# Patient Record
Sex: Female | Born: 1985 | Hispanic: No | Marital: Single | Smoking: Never smoker
Health system: Southern US, Community
[De-identification: ages and names within clinical notes are randomized; demographics above are authoritative.]

---
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
## 2010-06-27 ENCOUNTER — Encounter (INDEPENDENT_AMBULATORY_CARE_PROVIDER_SITE_OTHER): Payer: Self-pay | Admitting: *Deleted

## 2010-06-27 ENCOUNTER — Other Ambulatory Visit: Payer: Self-pay | Admitting: Gastroenterology

## 2010-06-27 ENCOUNTER — Ambulatory Visit
Admission: RE | Admit: 2010-06-27 | Discharge: 2010-06-27 | Payer: Self-pay | Source: Home / Self Care | Attending: Gastroenterology | Admitting: Gastroenterology

## 2010-06-27 DIAGNOSIS — K219 Gastro-esophageal reflux disease without esophagitis: Secondary | ICD-10-CM | POA: Insufficient documentation

## 2010-06-27 DIAGNOSIS — R1012 Left upper quadrant pain: Secondary | ICD-10-CM | POA: Insufficient documentation

## 2010-06-27 LAB — BASIC METABOLIC PANEL
BUN: 14 mg/dL (ref 6–23)
CO2: 29 mEq/L (ref 19–32)
Calcium: 9.6 mg/dL (ref 8.4–10.5)
Chloride: 104 mEq/L (ref 96–112)
Creatinine, Ser: 0.7 mg/dL (ref 0.4–1.2)
GFR: 112.27 mL/min (ref >60.00)
Glucose, Bld: 83 mg/dL (ref 70–99)
Potassium: 5.2 mEq/L — ABNORMAL HIGH (ref 3.5–5.1)
Sodium: 140 mEq/L (ref 135–145)

## 2010-06-27 LAB — CBC WITH DIFFERENTIAL/PLATELET
Basophils Absolute: 0 10*3/uL (ref 0.0–0.1)
Basophils Relative: 0.6 % (ref 0.0–3.0)
Eosinophils Absolute: 0.4 10*3/uL (ref 0.0–0.7)
Eosinophils Relative: 5.7 % — ABNORMAL HIGH (ref 0.0–5.0)
HCT: 40.8 % (ref 36.0–46.0)
Hemoglobin: 13.8 g/dL (ref 12.0–15.0)
Lymphocytes Relative: 32.9 % (ref 12.0–46.0)
Lymphs Abs: 2.5 10*3/uL (ref 0.7–4.0)
MCHC: 33.8 g/dL (ref 30.0–36.0)
MCV: 85.9 fl (ref 78.0–100.0)
Monocytes Absolute: 0.6 10*3/uL (ref 0.1–1.0)
Monocytes Relative: 7.4 % (ref 3.0–12.0)
Neutro Abs: 4 10*3/uL (ref 1.4–7.7)
Neutrophils Relative %: 53.4 % (ref 43.0–77.0)
Platelets: 276 10*3/uL (ref 150.0–400.0)
RBC: 4.75 Mil/uL (ref 3.87–5.11)
RDW: 13 % (ref 11.5–14.6)
WBC: 7.5 10*3/uL (ref 4.5–10.5)

## 2010-06-27 LAB — IGA: IgA: 298 mg/dL (ref 68–378)

## 2010-06-27 LAB — HEPATIC FUNCTION PANEL
ALT: 15 U/L (ref 0–35)
AST: 17 U/L (ref 0–37)
Albumin: 4.3 g/dL (ref 3.5–5.2)
Alkaline Phosphatase: 45 U/L (ref 39–117)
Bilirubin, Direct: 0.1 mg/dL (ref 0.0–0.3)
Total Bilirubin: 0.6 mg/dL (ref 0.3–1.2)
Total Protein: 7.6 g/dL (ref 6.0–8.3)

## 2010-06-27 LAB — FOLATE: Folate: 11 ng/mL (ref >5.9)

## 2010-06-27 LAB — IBC PANEL
Iron: 74 ug/dL (ref 42–145)
Saturation Ratios: 17.5 % — ABNORMAL LOW (ref 20.0–50.0)
Transferrin: 302.8 mg/dL (ref 212.0–360.0)

## 2010-06-27 LAB — VITAMIN B12: Vitamin B-12: 405 pg/mL (ref 211–911)

## 2010-06-27 LAB — AMYLASE: Amylase: 47 U/L (ref 27–131)

## 2010-06-27 LAB — LIPASE: Lipase: 36 U/L (ref 11.0–59.0)

## 2010-06-27 LAB — TSH: TSH: 2.21 u[IU]/mL (ref 0.35–5.50)

## 2010-06-27 LAB — FERRITIN: Ferritin: 6.2 ng/mL — ABNORMAL LOW (ref 10.0–291.0)

## 2010-06-27 LAB — SEDIMENTATION RATE: Sed Rate: 13 mm/hr (ref 0–22)

## 2010-06-30 ENCOUNTER — Ambulatory Visit
Admission: RE | Admit: 2010-06-30 | Discharge: 2010-06-30 | Payer: Self-pay | Source: Home / Self Care | Attending: Gastroenterology | Admitting: Gastroenterology

## 2010-06-30 ENCOUNTER — Telehealth: Payer: Self-pay | Admitting: Gastroenterology

## 2010-06-30 ENCOUNTER — Other Ambulatory Visit: Payer: Self-pay | Admitting: Gastroenterology

## 2010-06-30 ENCOUNTER — Ambulatory Visit (HOSPITAL_COMMUNITY)
Admission: RE | Admit: 2010-06-30 | Discharge: 2010-06-30 | Payer: Self-pay | Source: Home / Self Care | Attending: Gastroenterology | Admitting: Gastroenterology

## 2010-06-30 DIAGNOSIS — R109 Unspecified abdominal pain: Secondary | ICD-10-CM

## 2010-07-01 LAB — HELICOBACTER PYLORI SCREEN-BIOPSY: UREASE: NEGATIVE

## 2010-07-02 ENCOUNTER — Encounter: Payer: Self-pay | Admitting: Gastroenterology

## 2010-07-10 NOTE — Assessment & Plan Note (Addendum)
Summary: STOMACH PAIN AFTER MEALS,ACID REFLUX.Marland KitchenEM   History of Present Illness Visit Type: Initial Visit Primary GI MD: Sheryn Bison MD FACP FAGA Primary Provider: Windle Guard, MD Chief Complaint: Substernal intermittant chest pain that radiates to rib cage with some SOB. Pt denies any N/V. Pt states the pain can last all day and has been going on for 6 months. Pt has some heartburn and reflux througout the day. History of Present Illness:   Very Pleasant 25 year old Caucasian female self referred for evaluation of 6 months of subxiphoid discomfort radiating into her left upper quadrant lasting several hours in duration without precipitating or alleviating elements. She denies dysphagia, reflux symptoms, hepatobiliary complaints, or any systemic complaints. She has tried antacids without relief. She's not had previous radiographs or endoscopic studies.  Her pain occurs almost daily but has no relationship to exercise or meals. She denies any history of trauma. There is no history of any cardiopulmonary complaints. She specifically denies chest pain with exertion, palpitations, shortness of breath, cough or sputum production. She has regular bowel movements without melena or hematochezia.  Family history is remarkable for thyroid goiters in several family members. Patient does smoke a half to one pack of cigarettes per day but denies use of alcohol or NSAIDs.     GI Review of Systems    Reports abdominal pain, acid reflux, belching, bloating, chest pain, and  loss of appetite.     Location of  Abdominal pain: substernal.    Denies dysphagia with liquids, dysphagia with solids, heartburn, nausea, vomiting, vomiting blood, weight loss, and  weight gain.        Denies anal fissure, black tarry stools, change in bowel habit, constipation, diarrhea, diverticulosis, fecal incontinence, heme positive stool, hemorrhoids, irritable bowel syndrome, jaundice, light color stool, liver problems,  rectal bleeding, and  rectal pain. Preventive Screening-Counseling & Management  Alcohol-Tobacco     Smoking Status: current      Drug Use:  no.      Current Medications (verified): 1)  None  Allergies (verified): No Known Drug Allergies  Past History:  Past medical, surgical, family and social histories (including risk factors) reviewed for relevance to current acute and chronic problems.  Past Medical History: Unremarkable  Past Surgical History: elbow arthroscopy  Family History: Reviewed history and no changes required. 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  Alcohol Use - no Daily Caffeine Use 1 daily Illicit Drug Use - no Smoking Status:  current Drug Use:  no  Review of Systems       The patient complains of shortness of breath.  The patient denies allergy/sinus, anemia, anxiety-new, arthritis/joint pain, back pain, blood in urine, breast changes/lumps, change in vision, confusion, cough, coughing up blood, depression-new, fainting, fatigue, fever, headaches-new, hearing problems, heart murmur, heart rhythm changes, itching, menstrual pain, muscle pains/cramps, night sweats, nosebleeds, pregnancy symptoms, skin rash, sleeping problems, sore throat, swelling of feet/legs, swollen lymph glands, thirst - excessive , urination - excessive , urination changes/pain, urine leakage, vision changes, and voice change.         Normal menstrual periods but the patient apparently does not have regular gynecologic evaluations.  Vital Signs:  Patient profile:   25 year old female Height:      66 inches Weight:      141.50 pounds BMI:     22.92 Pulse rate:   88 / minute Pulse rhythm:  regular BP sitting:   112 / 70  (right arm) Cuff size:   regular  Vitals Entered By: Christie Nottingham CMA Duncan Dull) (June 27, 2010 8:29 AM)  Physical Exam  General:  Well developed, well  nourished, no acute distress.healthy appearing.   Head:  Normocephalic and atraumatic. Eyes:  PERRLA, no icterus.exam deferred to patient's ophthalmologist.   Neck:  Supple; no masses or thyromegaly. Lungs:  Clear throughout to auscultation. Heart:  Regular rate and rhythm; no murmurs, rubs,  or bruits. Abdomen:  Soft, nontender and nondistended. No masses, hepatosplenomegaly or hernias noted. Normal bowel sounds. Pulses:  Normal pulses noted. Extremities:  No clubbing, cyanosis, edema or deformities noted. Neurologic:  Alert and  oriented x4;  grossly normal neurologically. Cervical Nodes:  No significant cervical adenopathy. Psych:  Alert and cooperative. Normal mood and affect.   Impression & Recommendations:  Problem # 1:  ABDOMINAL PAIN, LEFT UPPER QUADRANT (ICD-789.02) Assessment Deteriorated Most of her discomfort is in her subxiphoid area. I suspect this is an atypical manifestation of gastroesophageal reflux. I have scheduled her for ultrasonography and endoscopy and lab exams. She's been started on Nexium 40 mg twice a day and p.r.n. sublingual Levsin pending her evaluation. Reflux maneuvers also reviewed. Cardiopulmonary exam today was normal. Orders: EGD (EGD) TLB-CBC Platelet - w/Differential (85025-CBCD) TLB-BMP (Basic Metabolic Panel-BMET) (80048-METABOL) TLB-Hepatic/Liver Function Pnl (80076-HEPATIC) TLB-TSH (Thyroid Stimulating Hormone) (84443-TSH) TLB-B12, Serum-Total ONLY (04540-J81) TLB-Ferritin (82728-FER) TLB-Folic Acid (Folate) (82746-FOL) TLB-IBC Pnl (Iron/FE;Transferrin) (83550-IBC) TLB-IgA (Immunoglobulin A) (82784-IGA) TLB-Amylase (82150-AMYL) TLB-Lipase (83690-LIPASE) TLB-Sedimentation Rate (ESR) (85652-ESR) Ultrasound Abdomen (UAS)  Problem # 2:  GERD (ICD-530.81) Assessment: Comment Only Atypical symptomatology. We need to rule out cholelithiasis, pancreatitis, or upper GI structural lesions. Orders: EGD (EGD) TLB-CBC Platelet - w/Differential  (85025-CBCD) TLB-BMP (Basic Metabolic Panel-BMET) (80048-METABOL) TLB-Hepatic/Liver Function Pnl (80076-HEPATIC) TLB-TSH (Thyroid Stimulating Hormone) (84443-TSH) TLB-B12, Serum-Total ONLY (19147-W29) TLB-Ferritin (82728-FER) TLB-Folic Acid (Folate) (82746-FOL) TLB-IBC Pnl (Iron/FE;Transferrin) (83550-IBC) TLB-IgA (Immunoglobulin A) (82784-IGA) TLB-Amylase (82150-AMYL) TLB-Lipase (83690-LIPASE) TLB-Sedimentation Rate (ESR) (85652-ESR)  Patient Instructions: 1)  Copy sent to :Windle Guard, MD 2)  Your procedure has been scheduled for 06/30/2010, please follow the seperate instructions.  3)  [facility] Patient Information Guide given to patient.  4)  Upper Endoscopy brochure given.  5)  Your abdominal ultrasound is scheduled for 06/30/2010, please follow the seperate instructions.  6)  Avoid foods high in acid content ( tomatoes, citrus juices, spicy foods) . Avoid eating within 3 to 4 hours of lying down or before exercising. Do not over eat; try smaller more frequent meals. Elevate head of bed four inches when sleeping.  7)  GI Reflux brochure given.  8)  Please go to the basement today for your labs.  9)  Your prescription(s) have been sent to you pharmacy.  10)  The medication list was reviewed and reconciled.  All changed / newly prescribed medications were explained.  A complete medication list was provided to the patient / caregiver. Prescriptions: TRAMADOL HCL 50 MG TABS (TRAMADOL HCL) take one by mouth two times a day as needed  #60 x 0   Entered by:   Harlow Mares CMA (AAMA)   Authorized by:   Mardella Layman MD [facility]   Signed by:   Harlow Mares CMA (AAMA) on 06/27/2010   Method used:   Faxed to ...       [facility]  [address] [phone][facility] (retail)       [address]  [place], [place]  [address]       Ph: [phone] or [phone]       Fax: [phone]   RxID:   [id number] HYOMAX 0.125 MG TABS (HYOSCYAMINE SULFATE) take  one by mouth two times a day as needed  #60 x 3   Entered by:   Harlow Mares CMA (AAMA)   Authorized by:   Mardella Layman MD [facility]   Signed by:   Harlow Mares CMA (AAMA) on 06/27/2010   Method used:   [facility]ron[facility]        [facility]  [address] [phone]* (retail)       [address]       [place], [place]  [id number]       Ph: [phone] or [phone]       Fax: [phone]   RxID:   [id number] NEXIUM 40 MG CPDR (ESOMEPRAZOLE MAGNESIUM) take one by mouth two times a day  #60 x 3   Entered by:   Harlow Mares CMA (AAMA)   Authorized by:   Mardella Layman MD [facility]   Signed by:   Harlow Mares CMA (AAMA) on 06/27/2010   Method used:   [facility]        [facility]  [address] [phone]* (retail)       [address]       [place], [place]  [id number]       Ph: [phone] or [phone]       Fax: [phone]   RxID:   [id number]

## 2010-07-10 NOTE — Procedures (Signed)
Summary: Upper Endoscopy  Patient: [name] Note: All result statuses are Final unless otherwise noted.  Tests: (1) Upper Endoscopy (EGD)   EGD Upper Endoscopy       DONE     [facility]     [address]     [place], [place]  [address]           ENDOSCOPY PROCEDURE REPORT           PATIENT:  Erin Hart, Erin Hart  MR#:  [id number]     BIRTHDATE:  [birth year], 24 yrs. old  GENDER:  female           ENDOSCOPIST:  Vania Rea. Jarold Motto, MD, [facility]     Referred by:           PROCEDURE DATE:  06/30/2010     PROCEDURE:  EGD with biopsy, 19147     ASA CLASS:  Class I     INDICATIONS:  LUQ PAIN.ULTRASOUND IS WNL.           MEDICATIONS:   Fentanyl 75 mcg IV, Versed 7 mg IV     TOPICAL ANESTHETIC:  Exactacain Spray           DESCRIPTION OF PROCEDURE:   After the risks benefits and     alternatives of the procedure were thoroughly explained, informed     consent was obtained.  The LB [id number]-[id number] endoscope was     introduced through the mouth and advanced to the second portion of     the duodenum, without limitations.  The instrument was slowly     withdrawn as the mucosa was fully examined.     <<PROCEDUREIMAGES>>           The upper, middle, and distal third of the esophagus were     carefully inspected and no abnormalities were noted. The z-line     was well seen at the GEJ. The endoscope was pushed into the fundus     which was normal including a retroflexed view. The antrum,gastric     body, first and second part of the duodenum were unremarkable.     ESOPHAGEAL AND DUODENAL BIOPSIES DONE.  There were mucosal changes     in the proximal esophagus consistent with a thoracic inlet patch.     SEE PICTURES.    Retroflexed views revealed no abnormalities.     The scope was then withdrawn from the patient and the procedure     completed.           COMPLICATIONS:  None           ENDOSCOPIC IMPRESSION:     1) Normal EGD     2) Thoracic inlet patch     PROBABLE  GERD,NONEROSIVE.     RECOMMENDATIONS:     1) Await biopsy results     2) continue PPI     WILL RX QAM PPI THAT INSURANCE WILL PAY.           REPEAT EXAM:  No           ______________________________     Vania Rea. Jarold Motto, MD, [facility]           CC:  Windle Guard, MD           n.     Rosalie Doctor:   Vania Rea. Patterson at 06/30/2010 03:33 PM           Revonda Standard, [id number]  Note: An exclamation mark (!) indicates  a result that was not dispersed into the flowsheet. Document Creation Date: 06/30/2010 3:33 PM _______________________________________________________________________  (1) Order result status: Final Collection or observation date-time: 06/30/2010 15:23 Requested date-time:  Receipt date-time:  Reported date-time:  Referring Physician:   Ordering Physician: Sheryn Bison [phone]) Specimen Source:  Source: Launa Grill Order Number: [id number] Lab site:

## 2010-07-10 NOTE — Progress Notes (Signed)
Summary: Medication  Phone Note Call from Patient   Caller: Father Erin Hart [phone] Call For: Dr. Jarold Motto Reason for Call: Talk to Nurse Summary of Call: Nexium is too expensive...Marland Kitchenneeds an alternative med Initial call taken by: Karna Christmas,  June 30, 2010 12:16 PM  Follow-up for Phone Call        Left a message on patients machine to call back. Follow-up by: Harlow Mares CMA Duncan Dull),  June 30, 2010 1:32 PM  Additional Follow-up for Phone Call Additional follow up Details #1::        spoke to pt in [facility] she said that the pharm did not have current insurance card and she will call me tomorrow.  Additional Follow-up by: Harlow Mares CMA Duncan Dull),  June 30, 2010 1:41 PM    Additional Follow-up for Phone Call Additional follow up Details #2::    talked to Dr. Jarold Motto and pts father I have sent in omeprazole and an iron supp for her to take each once a day.  Follow-up by: Harlow Mares CMA Duncan Dull),  June 30, 2010 3:37 PM  New/Updated Medications: OMEPRAZOLE 40 MG CPDR (OMEPRAZOLE) take one by mouth once daily FERROUS SULFATE 325 (65 FE) MG TABS (FERROUS SULFATE) take one by mouth once daily Prescriptions: FERROUS SULFATE 325 (65 FE) MG TABS (FERROUS SULFATE) take one by mouth once daily  #30 x 6   Entered by:   Harlow Mares CMA (AAMA)   Authorized by:   Mardella Layman MD [facility]   Signed by:   Harlow Mares CMA (AAMA) on 06/30/2010   Method used:   [facility]        [facility]  [address] [phone]* (retail)       [address]       [place], [place]  [address]       Ph: [phone] or [phone]       Fax: [phone]   RxID:   [id number] OMEPRAZOLE 40 MG CPDR (OMEPRAZOLE) take one by mouth once daily  #30 x 6   Entered by:   Harlow Mares CMA (AAMA)   Authorized by:   Mardella Layman MD [facility]   Signed by:   Harlow Mares CMA (AAMA) on 06/30/2010   Method used:   [facility]ron[facility]        [facility]  [address] [phone]*  (retail)       [address]       [place], [place]  [address]       Ph: [phone] or [phone]       Fax: [phone]   RxID:   [id number]

## 2010-07-10 NOTE — Letter (Signed)
Summary: EGD Instructions  [facility]  [address] [place], [place] [address]   Phone: [phone]  Fax: [phone]       Erin Hart    1985/12/02    MRN: [id number]       Procedure Day Dorna Bloom: Duanne Limerick 06/30/2010     Arrival Time:  1:30pm     Procedure Time: 2:30pm     Location of Procedure:                    [facility] (4th Floor)   PREPARATION FOR ENDOSCOPY   On 06/30/2010 THE DAY OF THE PROCEDURE:  Due to Abdominal Ultrasound same day Nothing to eat or drink after Midnight                                                                                                MEDICATION INSTRUCTIONS  Unless otherwise instructed, you should take regular prescription medications with a small sip of water as early as possible the morning of your procedure.        OTHER INSTRUCTIONS  You will need a responsible adult at least 23 years of age to accompany you and drive you home.   This person must remain in the waiting room during your procedure.  Wear loose fitting clothing that is easily removed.  Leave jewelry and other valuables at home.  However, you may wish to bring a book to read or an iPod/MP3 player to listen to music as you wait for your procedure to start.  Remove all body piercing jewelry and leave at home.  Total time from sign-in until discharge is approximately 2-3 hours.  You should go home directly after your procedure and rest.  You can resume normal activities the day after your procedure.  The day of your procedure you should not:   Drive   Make legal decisions   Operate machinery   Drink alcohol   Return to work  You will receive specific instructions about eating, activities and medications before you leave.    The above instructions have been reviewed and explained to me by   _______________________    I fully understand and can verbalize these instructions _____________________________ Date  _________

## 2010-07-10 NOTE — Letter (Signed)
Summary: Patient Notice-Endo Biopsy Results  [facility]  [address] [place], [place] [address]   Phone: [phone]  Fax: [phone]        July 02, 2010 MRN: [id number]    [facility] [address] [place], [place]  [address]    Dear Ms. Erin Hart,  I am pleased to inform you that the biopsies taken during your recent endoscopic examination did not show any evidence of cancer upon pathologic examination.  Additional information/recommendations:  __No further action is needed at this time.  Please follow-up with      your primary care physician for your other healthcare needs.  __ Please call [phone] to schedule a return visit to review      your condition.  xx__ Continue with the treatment plan as outlined on the day of your      exam.  __ You should have a repeat endoscopic examination for this problem              in _ months/years.   Please call us if you are having persistent problems or have questions about your condition that have not been fully answered at this time.  Sincerely,  Mardella Layman MD [facility]  This letter has been electronically signed by your physician.  Appended Document: Patient Notice-Endo Biopsy Results LETTER MAILED

## 2010-07-10 NOTE — Miscellaneous (Signed)
Summary: Orders Update clo  Clinical Lists Changes  Orders: Added new Test order of TLB-H Pylori Screen Gastric Biopsy (83013-CLOTEST) - Signed 

## 2010-07-29 ENCOUNTER — Encounter: Payer: Self-pay | Admitting: Gastroenterology

## 2010-07-29 ENCOUNTER — Ambulatory Visit (INDEPENDENT_AMBULATORY_CARE_PROVIDER_SITE_OTHER): Payer: BC Managed Care – PPO | Admitting: Gastroenterology

## 2010-07-29 DIAGNOSIS — K219 Gastro-esophageal reflux disease without esophagitis: Secondary | ICD-10-CM

## 2010-08-05 NOTE — Assessment & Plan Note (Signed)
Summary: F/u  after procedures    History of Present Illness Visit Type: Follow-up Visit Primary GI MD: Sheryn Bison MD FACP FAGA Primary Provider: Windle Guard, MD Requesting Provider: na Chief Complaint: F/u from EGD and [place].  Pt states that she is much better on medicines and denies any GI complaints  History of Present Illness:   Endoscopy and ultrasound exam were unremarkable. She is currently asymptomatic on Nexium 40 mg a day and antireflux maneuvers.   GI Review of Systems      Denies abdominal pain, acid reflux, belching, bloating, chest pain, dysphagia with liquids, dysphagia with solids, heartburn, loss of appetite, nausea, vomiting, vomiting blood, weight loss, and  weight gain.        Denies anal fissure, black tarry stools, change in bowel habit, constipation, diarrhea, diverticulosis, fecal incontinence, heme positive stool, hemorrhoids, irritable bowel syndrome, jaundice, light color stool, liver problems, rectal bleeding, and  rectal pain.    Current Medications (verified): 1)  Omeprazole 40 Mg Cpdr (Omeprazole) .... Take One By Mouth Once Daily 2)  Hyomax 0.125 Mg Tabs (Hyoscyamine Sulfate) .... Take One By Mouth Two Times A Day As Needed 3)  Tramadol Hcl 50 Mg Tabs (Tramadol Hcl) .... Take One By Mouth Two Times A Day As Needed  Allergies (verified): No Known Drug Allergies  Past History:  Past medical, surgical, family and social histories (including risk factors) reviewed for relevance to current acute and chronic problems.  Past Medical History: GERD (ICD-530.81) ABDOMINAL PAIN, LEFT UPPER QUADRANT (ICD-789.02)  Past Surgical History: Reviewed history from 06/27/2010 and no changes required. elbow arthroscopy  Family History: Reviewed history from 06/27/2010 and no changes required. No FH of Colon Cancer:  Social History: Reviewed history from 06/27/2010 and no changes required. 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  Alcohol  Use - no Daily Caffeine Use 1 daily Illicit Drug Use - no  Review of Systems  The patient denies allergy/sinus, anemia, anxiety-new, arthritis/joint pain, back pain, blood in urine, breast changes/lumps, change in vision, confusion, cough, coughing up blood, depression-new, fainting, fatigue, fever, headaches-new, hearing problems, heart murmur, heart rhythm changes, itching, menstrual pain, muscle pains/cramps, night sweats, nosebleeds, pregnancy symptoms, shortness of breath, skin rash, sleeping problems, sore throat, swelling of feet/legs, swollen lymph glands, thirst - excessive , urination - excessive , urination changes/pain, urine leakage, vision changes, and voice change.    Vital Signs:  Patient profile:   25 year old female Height:      66 inches Weight:      139 pounds BMI:     22.52 BSA:     1.72 Pulse rate:   88 / minute Pulse rhythm:   regular BP sitting:   118 / 74  (left arm) Cuff size:   regular  Vitals Entered By: Ok Anis CMA (July 29, 2010 3:00 PM)  Physical Exam  General:  Well developed, well nourished, no acute distress.healthy appearing.   Head:  Normocephalic and atraumatic. Eyes:  PERRLA, no icterus.exam deferred to patient's ophthalmologist.   Psych:  Alert and cooperative. Normal mood and affect.   Impression & Recommendations:  Problem # 1:  GERD (ICD-530.81) Assessment Improved Continue Nexium 40 mg a day and standard antireflux maneuvers. Patient education video shown to patient today, and she was placed on the anti-reflux diet. I will see her again in 6 months time or p.r.n. as needed.  Problem # 2:  ABDOMINAL PAIN, LEFT UPPER QUADRANT (ICD-789.02) Assessment: Improved resolved on therapy.  Patient  Instructions: 1)  Copy sent to : Windle Guard, MD 2)  Today you watched the movie on [employer]. 3)  Avoid foods high in acid content ( tomatoes, citrus juices, spicy foods) . Avoid eating within 3 to 4 hours of lying down or before exercising.  Do not over eat; try smaller more frequent meals. Elevate head of bed four inches when sleeping.  4)  The medication list was reviewed and reconciled.  All changed / newly prescribed medications were explained.  A complete medication list was provided to the patient / caregiver.

## 2010-11-17 ENCOUNTER — Telehealth: Payer: Self-pay | Admitting: Gastroenterology

## 2010-11-17 MED ORDER — TRAMADOL HCL 50 MG PO TABS: ORAL_TABLET | ORAL | Status: DC

## 2010-11-17 MED ORDER — OMEPRAZOLE 40 MG PO CPDR: 40.0000 mg | DELAYED_RELEASE_CAPSULE | Freq: Every day | ORAL | Status: DC

## 2010-11-17 NOTE — Telephone Encounter (Signed)
rx sent

## 2011-09-19 IMAGING — US US ABDOMEN COMPLETE
1 series · 13 of 25 positions shown · non-contrast
Comparison: None

CLINICAL DATA: History of abdominal pain.

ABDOMINAL ULTRASOUND COMPLETE

[Series 1: us abdomen complete · 0.26mm/px · 13 of 108 slices shown]
[im 1/108]
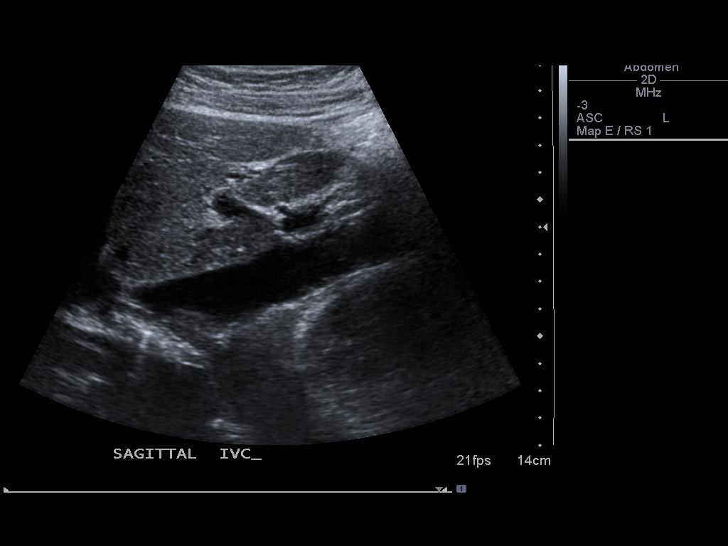
[im 9/108]
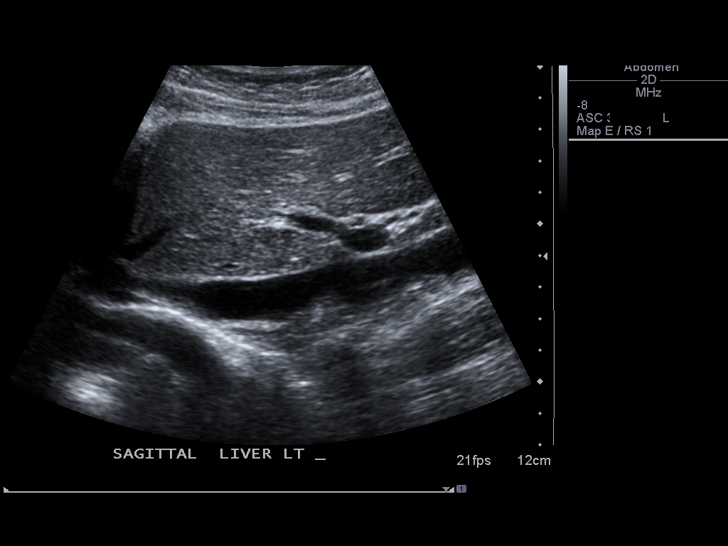
[im 18/108]
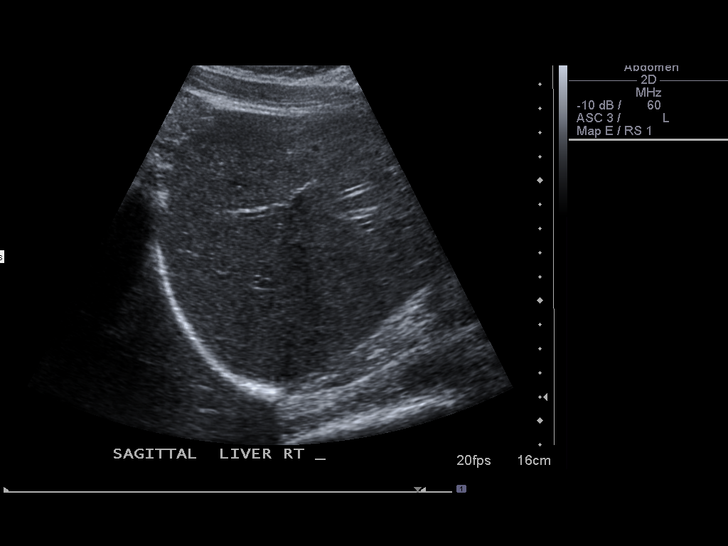
[im 27/108]
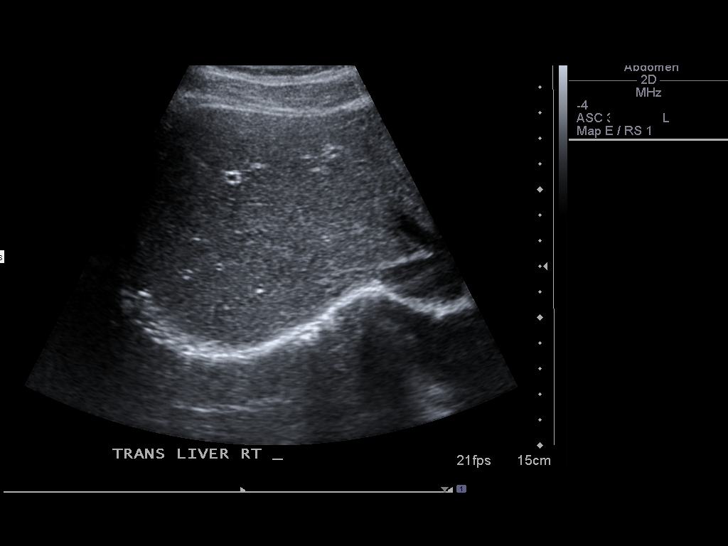
[im 36/108]
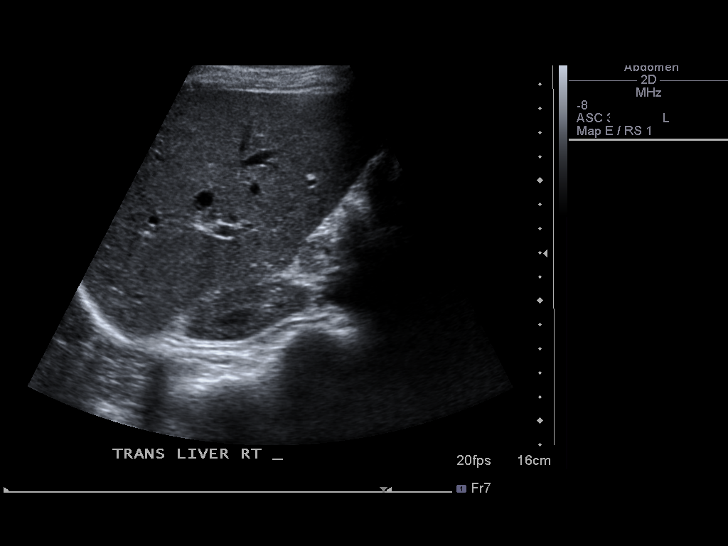
[im 45/108]
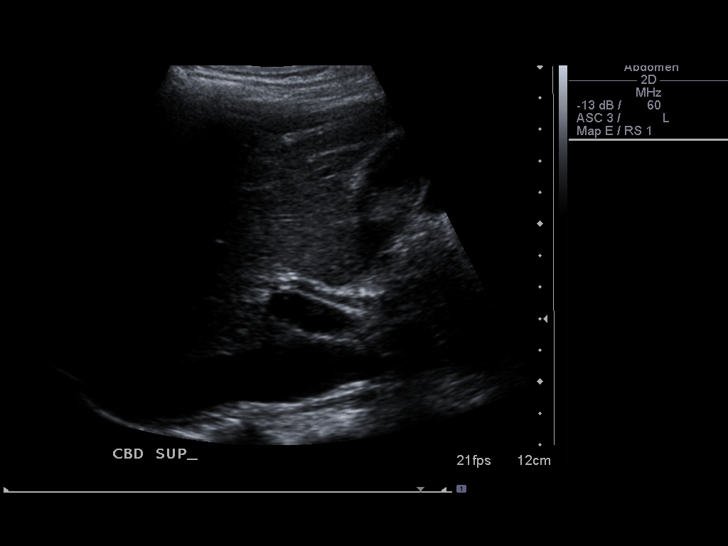
[im 54/108]
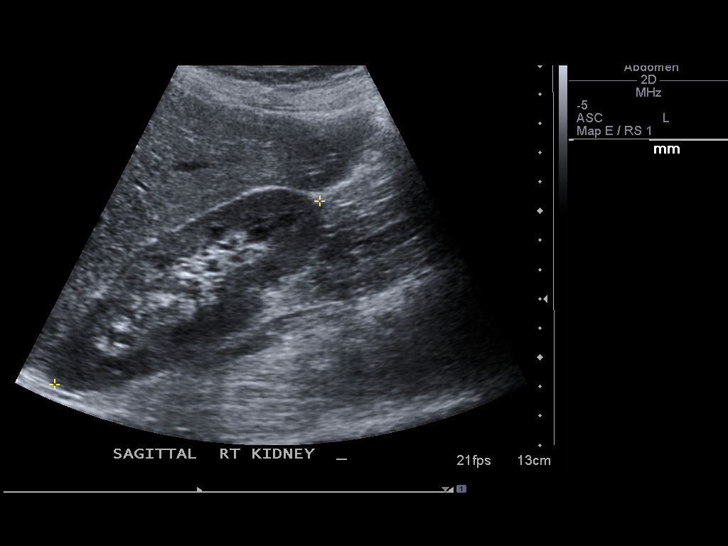
[im 63/108]
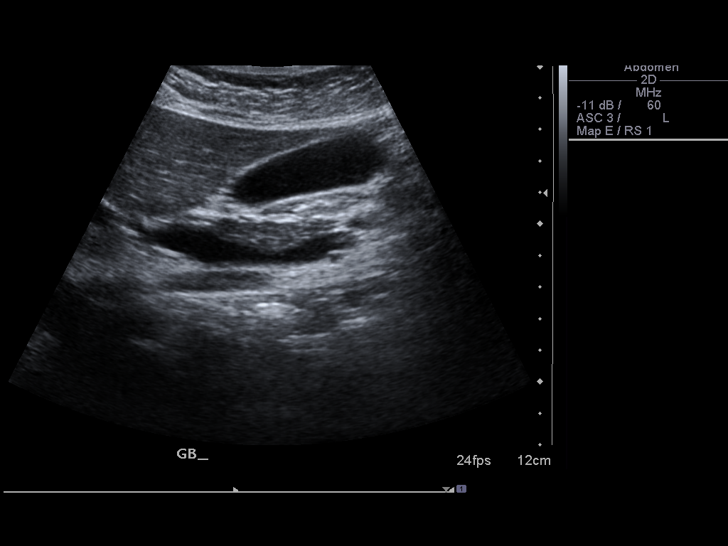
[im 72/108]
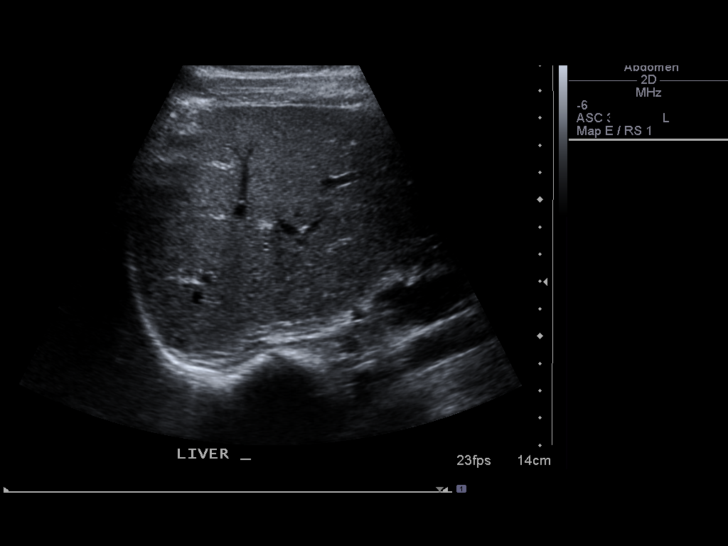
[im 81/108]
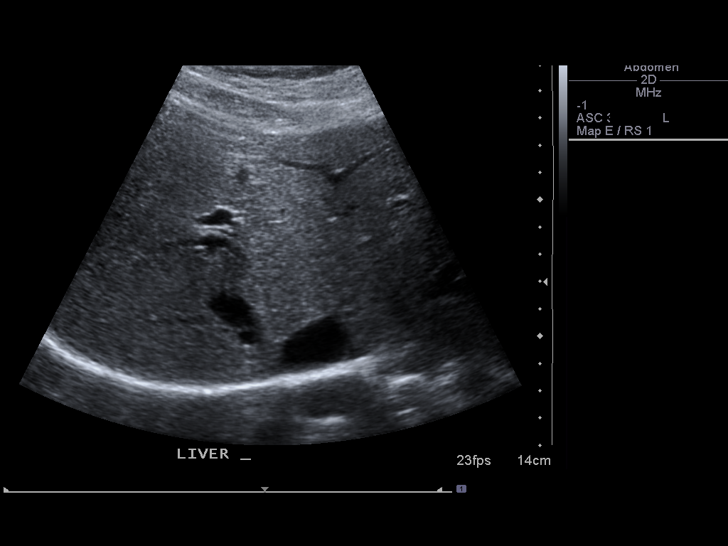
[im 90/108]
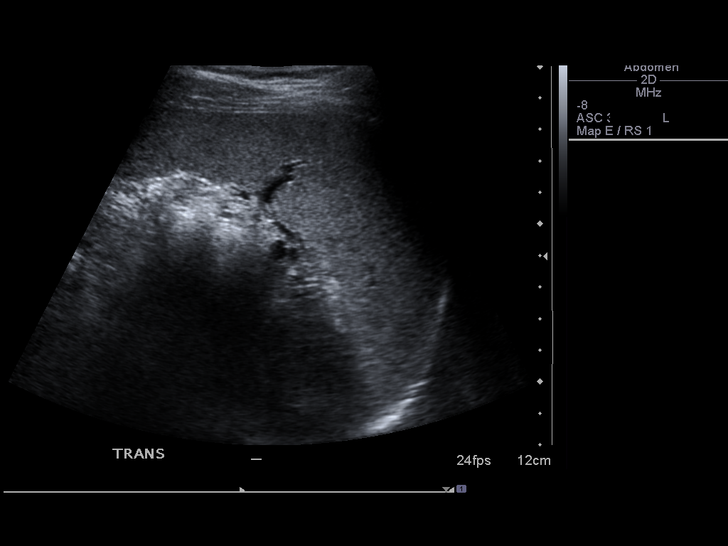
[im 99/108]
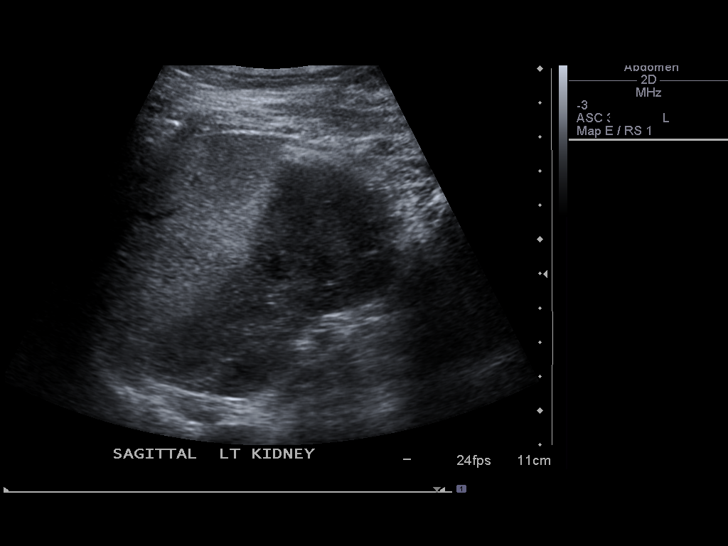
[im 108/108]
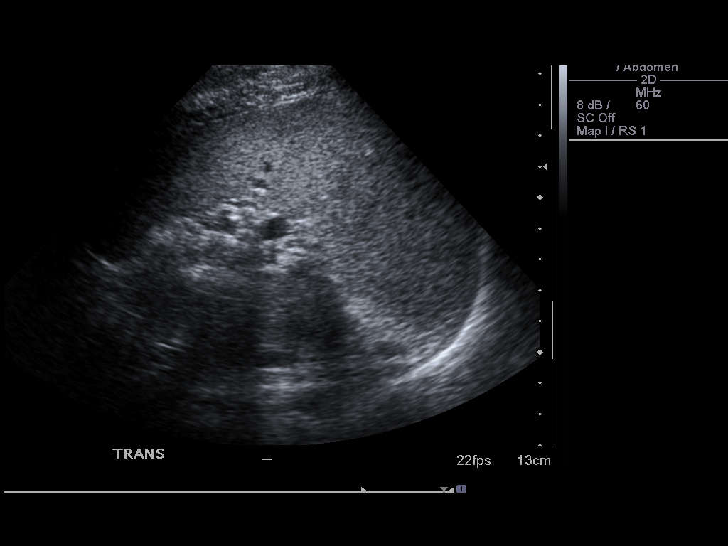

[13 of 25 positions shown; findings below may reference images not displayed]

FINDINGS: Gallbladder: No shadowing gallstones or echogenic sludge. No
gallbladder wall thickening or pericholecystic fluid. The
gallbladder wall thickness measured 2.0 mm. No sonographic Murphy's
sign according to the ultrasound technologist.

CBD: Normal in caliber measuring 2.8 mm. No choledocholithiasis is
evident.

Liver:  Normal size and echotexture without focal parenchymal
abnormality.  There is normal hepatopetal flow in the main portal
vein by color Doppler imaging.

IVC:  Patent throughout its visualized course in the abdomen.

Pancreas:  Although the pancreas is difficult to visualize in its
entirety, no focal pancreatic abnormality is identified.

Spleen:  Spleen is borderline in size with length of 13 cm and
volume of 404 ml.  No focal lesion is seen.

Right kidney:  No hydronephrosis.  Well-preserved cortex.  Normal
parenchymal echotexture without focal abnormalities.  Right renal
length is 11.0 cm.

Left kidney:  No hydronephrosis.  Well-preserved cortex.  Normal
parenchymal echotexture without focal abnormalities.  Left renal
length is 10.7 cm.

Aorta:  Maximum diameter is 1.9 cm.  No aneurysm is evident.

Ascites:  None.
IMPRESSION: No acute abdominal pathology was demonstrated.  Spleen is
borderline in size.  No focal lesion was seen.

## 2014-04-11 ENCOUNTER — Other Ambulatory Visit: Payer: Self-pay | Admitting: Obstetrics and Gynecology

## 2014-04-12 LAB — CYTOLOGY - PAP

## 2014-07-27 ENCOUNTER — Other Ambulatory Visit: Payer: Self-pay | Admitting: Occupational Medicine

## 2014-07-27 ENCOUNTER — Ambulatory Visit: Payer: Self-pay

## 2014-07-27 DIAGNOSIS — Z0000 Encounter for general adult medical examination without abnormal findings: Secondary | ICD-10-CM

## 2015-10-16 IMAGING — CR DG CHEST 1V
1 series · 1 of 1 positions shown · non-contrast
Comparison: None.

CLINICAL DATA: Current smoker.  Physical exam.

EXAM:
CHEST  1 VIEW

[view not recorded]
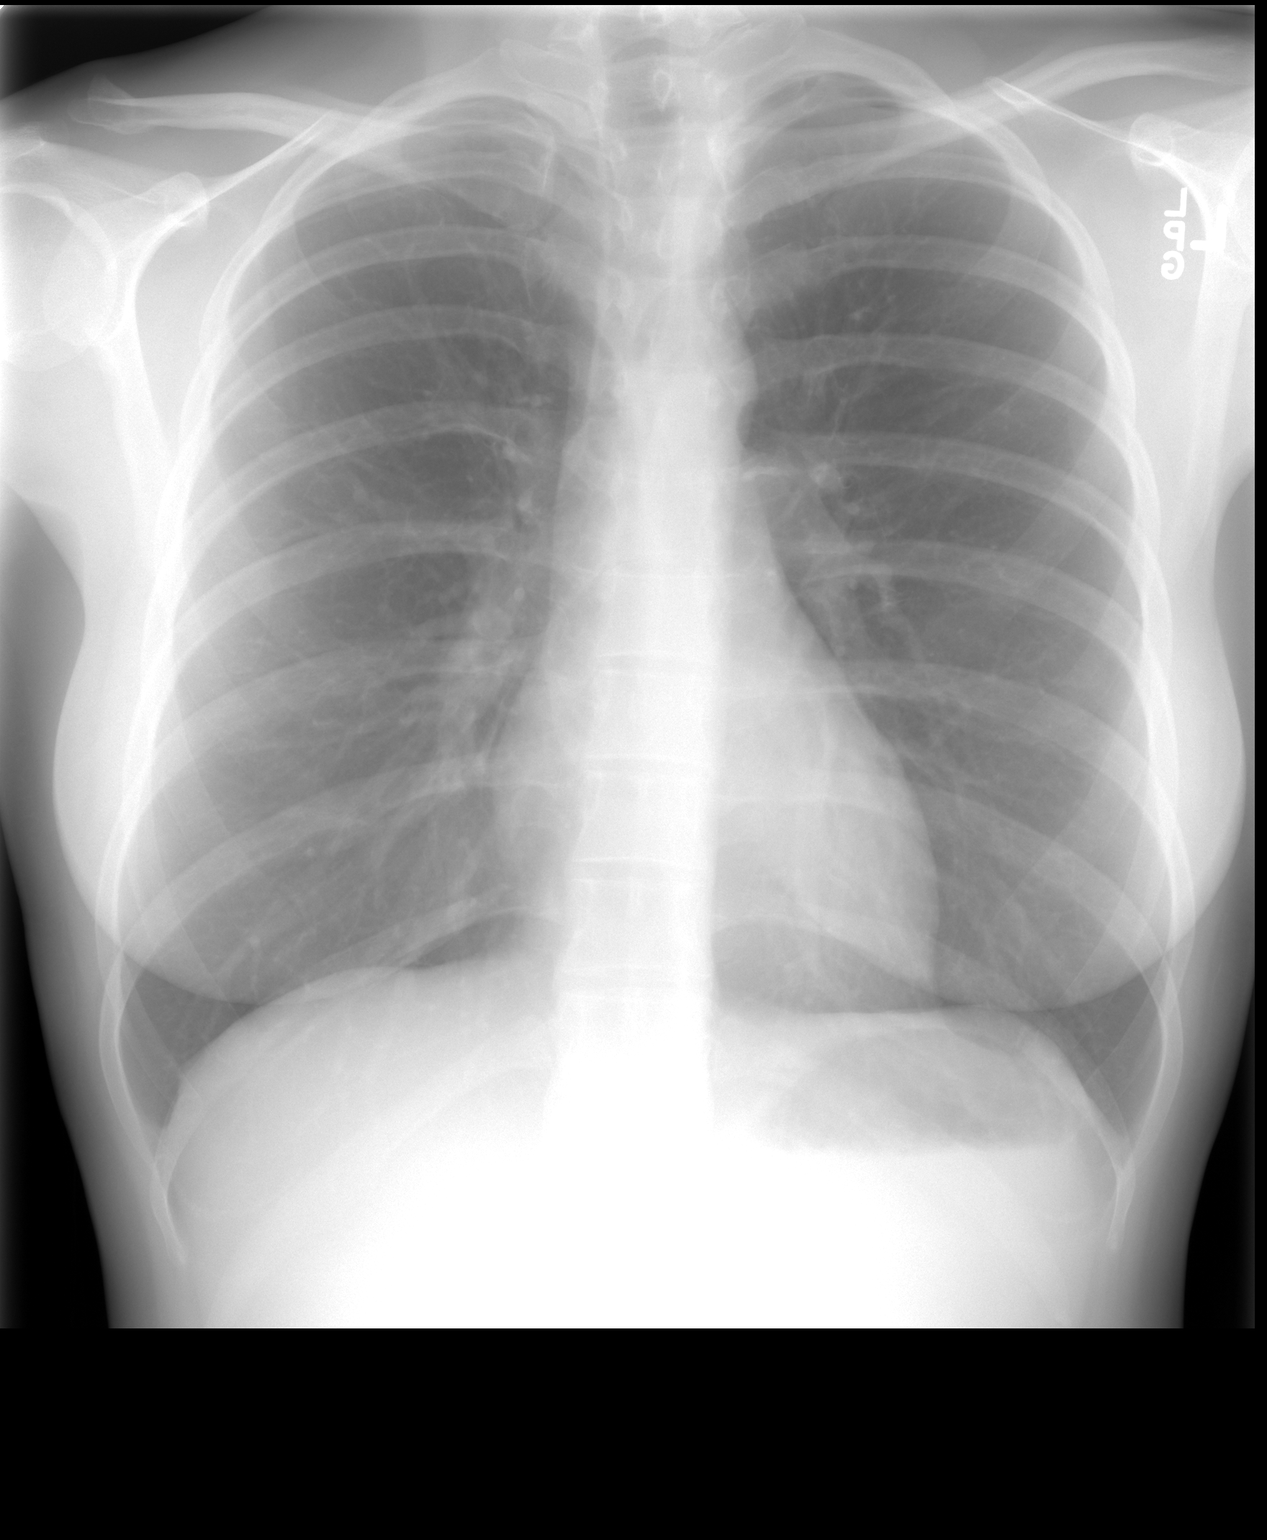

[1 of 1 positions shown; findings below may reference images not displayed]

FINDINGS: The heart size and mediastinal contours are within normal limits.
Both lungs are clear. The visualized skeletal structures are
unremarkable.
IMPRESSION: No active disease.

## 2017-08-27 ENCOUNTER — Other Ambulatory Visit: Payer: Self-pay

## 2017-08-27 ENCOUNTER — Encounter (HOSPITAL_COMMUNITY): Payer: Self-pay | Admitting: Emergency Medicine

## 2017-08-27 ENCOUNTER — Emergency Department (HOSPITAL_COMMUNITY)
Admission: EM | Admit: 2017-08-27 | Discharge: 2017-08-27 | Disposition: A | Discharge status: Home / Self Care | Payer: 59 | Attending: Emergency Medicine | Admitting: Emergency Medicine

## 2017-08-27 DIAGNOSIS — R112 Nausea with vomiting, unspecified: Secondary | ICD-10-CM | POA: Insufficient documentation

## 2017-08-27 DIAGNOSIS — R197 Diarrhea, unspecified: Secondary | ICD-10-CM | POA: Diagnosis not present

## 2017-08-27 DIAGNOSIS — E860 Dehydration: Secondary | ICD-10-CM | POA: Insufficient documentation

## 2017-08-27 LAB — URINALYSIS, ROUTINE W REFLEX MICROSCOPIC
Bilirubin Urine: NEGATIVE
Glucose, UA: NEGATIVE mg/dL
Hgb urine dipstick: NEGATIVE
KETONES UR: 5 mg/dL — AB
Leukocytes, UA: NEGATIVE
Nitrite: NEGATIVE
PROTEIN: 30 mg/dL — AB
Specific Gravity, Urine: 1.024 (ref 1.005–1.030)
pH: 5 (ref 5.0–8.0)

## 2017-08-27 LAB — COMPREHENSIVE METABOLIC PANEL
ALBUMIN: 4.3 g/dL (ref 3.5–5.0)
ALT: 22 U/L (ref 14–54)
ANION GAP: 9 (ref 5–15)
AST: 29 U/L (ref 15–41)
Alkaline Phosphatase: 56 U/L (ref 38–126)
BILIRUBIN TOTAL: 0.8 mg/dL (ref 0.3–1.2)
BUN: 16 mg/dL (ref 6–20)
CO2: 22 mmol/L (ref 22–32)
Calcium: 9.2 mg/dL (ref 8.9–10.3)
Chloride: 108 mmol/L (ref 101–111)
Creatinine, Ser: 0.96 mg/dL (ref 0.44–1.00)
GFR calc Af Amer: >60 mL/min (ref >60)
GFR calc non Af Amer: >60 mL/min (ref >60)
Glucose, Bld: 117 mg/dL — ABNORMAL HIGH (ref 65–99)
POTASSIUM: 4.5 mmol/L (ref 3.5–5.1)
Sodium: 139 mmol/L (ref 135–145)
TOTAL PROTEIN: 7.9 g/dL (ref 6.5–8.1)

## 2017-08-27 LAB — CBC WITH DIFFERENTIAL/PLATELET
BASOS PCT: 0 %
Basophils Absolute: 0 10*3/uL (ref 0.0–0.1)
Eosinophils Absolute: 0.1 10*3/uL (ref 0.0–0.7)
Eosinophils Relative: 0 %
HCT: 41.3 % (ref 36.0–46.0)
HEMOGLOBIN: 13.8 g/dL (ref 12.0–15.0)
LYMPHS ABS: 0.5 10*3/uL — AB (ref 0.7–4.0)
Lymphocytes Relative: 3 %
MCH: 28.6 pg (ref 26.0–34.0)
MCHC: 33.4 g/dL (ref 30.0–36.0)
MCV: 85.7 fL (ref 78.0–100.0)
Monocytes Absolute: 0.3 10*3/uL (ref 0.1–1.0)
Monocytes Relative: 2 %
NEUTROS PCT: 95 %
Neutro Abs: 14.3 10*3/uL — ABNORMAL HIGH (ref 1.7–7.7)
Platelets: 260 10*3/uL (ref 150–400)
RBC: 4.82 MIL/uL (ref 3.87–5.11)
RDW: 13 % (ref 11.5–15.5)
WBC: 15.2 10*3/uL — AB (ref 4.0–10.5)

## 2017-08-27 LAB — LIPASE, BLOOD: Lipase: 27 U/L (ref 11–51)

## 2017-08-27 LAB — I-STAT BETA HCG BLOOD, ED (MC, WL, AP ONLY)

## 2017-08-27 MED ORDER — ONDANSETRON HCL 4 MG PO TABS: 4.0000 mg | ORAL_TABLET | Freq: Four times a day (QID) | ORAL | 0 refills | Status: DC

## 2017-08-27 MED ORDER — SODIUM CHLORIDE 0.9 % IV BOLUS (SEPSIS)
1000.0000 mL | Freq: Once | INTRAVENOUS | Status: AC
  Administered 2017-08-27: 1000 mL via INTRAVENOUS

## 2017-08-27 MED ORDER — ACETAMINOPHEN 325 MG PO TABS
650.0000 mg | ORAL_TABLET | Freq: Once | ORAL | Status: AC
  Administered 2017-08-27: 650 mg via ORAL
  Filled 2017-08-27: qty 2

## 2017-08-27 NOTE — ED Triage Notes (Addendum)
Per EMS pt states around midnight started N/V/D. Says she unknowingly ate cheese that expired in January. 4mg  Zofran and NaCl 500ml IV en route

## 2017-08-27 NOTE — ED Provider Notes (Signed)
[facility]-EMERGENCY DEPT Provider Note   CSN: [id number] Arrival date & time: 08/27/17  0736     History   Chief Complaint Chief Complaint  Patient presents with  . Nausea  . Emesis  . Diarrhea    HPI Eduardo Wurth is a 32 y.o. female.  She presents via ambulance after acute onset of nausea vomiting diarrhea since 1 AM this morning.  She states is been continuously and has been associated with generalized weakness lightheadedness and 2 episodes of syncope.  Her parents have witnessed this and they were helping her back and forth to the bathroom.  The parents say she was mumbling incoherently and did not seem to know what was going on.  It is associated with diffuse abdominal pain and generalized body pain.  There is been no fever.  No blood from either end.  No specific sick contacts.  Suspicious food intake with some expired cheese.  Patient has been sick for the last 2 months with a prior stomach flu, chest flu, and sinus infection.  It sounds like she has not fully recovered from these things.  She is also started with a week and is in complaining of some right shoulder pain.  The history is provided by the patient and a parent.  Emesis   This is a new problem. The current episode started 6 to 12 hours ago. The problem occurs continuously. The problem has been gradually improving. The emesis has an appearance of stomach contents. There has been no fever. Associated symptoms include abdominal pain, diarrhea and myalgias. Pertinent negatives include no arthralgias, no chills, no cough, no fever, no headaches and no URI. Risk factors include suspect food intake.    History reviewed. No pertinent past medical history.  Patient Active Problem List   Diagnosis Date Noted  . GERD 06/27/2010  . ABDOMINAL PAIN, LEFT UPPER QUADRANT 06/27/2010    History reviewed. No pertinent surgical history.  OB History   None      Home Medications    Prior to Admission  medications   Medication Sig Start Date End Date Taking? Authorizing Provider  omeprazole (PRILOSEC) 40 MG capsule Take 1 capsule (40 mg total) by mouth daily. 11/17/10 11/17/11  Mardella Layman, MD  traMADol Janean Sark) 50 MG tablet Take one by mouth twice a day as needed 11/17/10   Mardella Layman, MD    Family History History reviewed. No pertinent family history.  Social History Social History   Tobacco Use  . Smoking status: Not on file  Substance Use Topics  . Alcohol use: Not on file  . Drug use: Not on file     Allergies   Patient has no allergy information on record.   Review of Systems Review of Systems  Constitutional: Positive for fatigue. Negative for chills and fever.  HENT: Negative for ear pain and sore throat.   Eyes: Negative for pain and visual disturbance.  Respiratory: Negative for cough and shortness of breath.   Cardiovascular: Negative for chest pain and palpitations.  Gastrointestinal: Positive for abdominal pain, diarrhea, nausea and vomiting.  Genitourinary: Negative for dysuria, frequency, hematuria, vaginal bleeding and vaginal discharge.  Musculoskeletal: Positive for back pain and myalgias. Negative for arthralgias and neck pain.  Skin: Negative for color change and rash.  Neurological: Positive for syncope. Negative for seizures and headaches.  Psychiatric/Behavioral: Positive for confusion.  All other systems reviewed and are negative.    Physical Exam Updated Vital Signs BP 103/74  Pulse 85   Temp 98.6 F (37 C) (Oral)   Resp 16   SpO2 100%   Physical Exam  Constitutional: She is oriented to person, place, and time. She appears well-developed and well-nourished. No distress.  HENT:  Head: Normocephalic and atraumatic.  Eyes: Conjunctivae are normal. No scleral icterus.  Neck: Trachea normal and full passive range of motion without pain. Neck supple.  Cardiovascular: Normal rate and regular rhythm.  No murmur  heard. Pulmonary/Chest: Effort normal and breath sounds normal. No respiratory distress.  Abdominal: Soft. There is no tenderness.  Musculoskeletal: Normal range of motion. She exhibits no edema or deformity.  Neurological: She is alert and oriented to person, place, and time. She has normal strength. No cranial nerve deficit or sensory deficit. GCS eye subscore is 4. GCS verbal subscore is 5. GCS motor subscore is 6.  Skin: Skin is warm and dry. Capillary refill takes less than 2 seconds.  Psychiatric: She has a normal mood and affect.  Nursing note and vitals reviewed.     ED Treatments / Results  Labs (all labs ordered are listed, but only abnormal results are displayed) Labs Reviewed  COMPREHENSIVE METABOLIC PANEL - Abnormal; Notable for the following components:      Result Value   Glucose, Bld 117 (*)    All other components within normal limits  CBC WITH DIFFERENTIAL/PLATELET - Abnormal; Notable for the following components:   WBC 15.2 (*)    Neutro Abs 14.3 (*)    Lymphs Abs 0.5 (*)    All other components within normal limits  URINALYSIS, ROUTINE W REFLEX MICROSCOPIC - Abnormal; Notable for the following components:   Color, Urine AMBER (*)    APPearance CLOUDY (*)    Ketones, ur 5 (*)    Protein, ur 30 (*)    Bacteria, UA FEW (*)    Squamous Epithelial / LPF 0-5 (*)    All other components within normal limits  LIPASE, BLOOD  HEPATITIS PANEL, ACUTE  I-STAT BETA HCG BLOOD, ED ([facility], [facility], [facility] ONLY)    EKG  EKG Interpretation None       EKG Interpretation  Date/Time:  Friday August 27 2017 08:20:48 EDT Ventricular Rate:  75 PR Interval:    QRS Duration: 99 QT Interval:  401 QTC Calculation: 448 R Axis:   -41 Text Interpretation:  Sinus rhythm Left axis deviation no prior to compare with Confirmed by Meridee Score [phone]) on 08/27/2017 8:42:43 AM        Radiology No results found.  Procedures Procedures (including critical care time)  Medications  Ordered in ED Medications  sodium chloride 0.9 % bolus 1,000 mL (0 mLs Intravenous Stopped 08/27/17 0929)  sodium chloride 0.9 % bolus 1,000 mL (0 mLs Intravenous Stopped 08/27/17 1123)  acetaminophen (TYLENOL) tablet 650 mg (650 mg Oral Given 08/27/17 0945)  sodium chloride 0.9 % bolus 1,000 mL (0 mLs Intravenous Stopped 08/27/17 1230)  sodium chloride 0.9 % bolus 1,000 mL (0 mLs Intravenous Stopped 08/27/17 1511)     Initial Impression / Assessment and Plan / ED Course  I have reviewed the triage vital signs and the nursing notes.  Pertinent labs & imaging results that were available during my care of the patient were reviewed by me and considered in my medical decision making (see chart for details).  Clinical Course as of Aug 28 1919  Fri Aug 27, 2017  [id number] 32 year old female with what sounds like some long-standing GI issues that have not been  been worked up here now with acute onset of multiple episodes of nausea vomiting diarrhea with associated lightheadedness weakness and syncope.  On exam she looks very washed out but her vitals have been normal here other than a soft blood pressure.  Her exam is benign.  Likely volume depletion from gastroenteritis but sending screening labs for hepatitis, infection, pregnancy.  Doubt cardiac arrhythmia as cause for syncope in the setting of a normal EKG with normal intervals.   [MB]  [id number] Updated patient on her results.  She is feeling slightly better.  Her temp is creeping up a little bit so we will order some Tylenol as she is taken nothing so far.   [MB]  1419 Reevaluated patient tolerated p.o. here and has not had any recurrence of vomiting or diarrhea.  She was orthostatic by heart rate and 1/4 L is infusing.  Looks like her baseline blood pressures in the 1 teens so we are not far off her baseline.   [MB]  1530 Patient states she feels well enough to go home and rather be discharged.  She understands what to watch out for all return if any  worsening symptoms.  I will prescribe her some Zofran which seemed to help her the most.  Currently her blood pressure is just about 100 with a heart rate in the 70s.  She has been up and ambulating to the bathroom without any difficulty.   [MB]    Clinical Course User Index [MB] Terrilee FilesButler, Duru Reiger C, MD    Final Clinical Impressions(s) / ED Diagnoses   Final diagnoses:  Nausea vomiting and diarrhea  Dehydration    ED Discharge Orders        Ordered    ondansetron (ZOFRAN) 4 MG tablet  Every 6 hours     08/27/17 1532       Terrilee FilesButler, Kira Hartl C, MD 08/27/17 Ernestina [id number]

## 2017-08-27 NOTE — Discharge Instructions (Signed)
Your evaluated in the emergency department for acute onset of nausea and vomiting and diarrhea.  You had lab work that was significant for an elevated white blood cell count.  You improved with some nausea medication and some IV fluids.  We are sending you home with a prescription for more nausea medicine and you should continue to try to stay well-hydrated.  If you have any worsening symptoms she should return to the emergency department.

## 2017-08-27 NOTE — ED Notes (Signed)
Pt given water 

## 2017-08-27 NOTE — ED Notes (Signed)
Bed: WA03 Expected date:  Expected time:  Means of arrival:  Comments: EMS 

## 2017-08-28 LAB — HEPATITIS PANEL, ACUTE
HCV Ab: <0.1 s/co ratio (ref 0.0–0.9)
Hep A IgM: NEGATIVE
Hep B C IgM: NEGATIVE
Hepatitis B Surface Ag: NEGATIVE

## 2019-07-04 ENCOUNTER — Ambulatory Visit: Payer: BC Managed Care – PPO | Attending: Internal Medicine

## 2019-07-04 DIAGNOSIS — Z20822 Contact with and (suspected) exposure to covid-19: Secondary | ICD-10-CM

## 2019-07-05 LAB — NOVEL CORONAVIRUS, NAA: SARS-CoV-2, NAA: NOT DETECTED

## 2019-12-06 ENCOUNTER — Telehealth: Payer: Self-pay | Admitting: General Practice

## 2019-12-06 NOTE — Telephone Encounter (Signed)
Received email request to schedule as new patient. Called patient and left voicemail for her to call the office to schedule.

## 2019-12-12 ENCOUNTER — Ambulatory Visit (INDEPENDENT_AMBULATORY_CARE_PROVIDER_SITE_OTHER): Payer: BC Managed Care – PPO | Admitting: Family Medicine

## 2019-12-12 ENCOUNTER — Encounter: Payer: Self-pay | Admitting: Family Medicine

## 2019-12-12 ENCOUNTER — Other Ambulatory Visit: Payer: Self-pay

## 2019-12-12 VITALS — BP 90/72 | HR 74 | Temp 98.5°F | Ht 65.75 in | Wt 139.4 lb

## 2019-12-12 DIAGNOSIS — Z0000 Encounter for general adult medical examination without abnormal findings: Secondary | ICD-10-CM | POA: Diagnosis not present

## 2019-12-12 DIAGNOSIS — Z7689 Persons encountering health services in other specified circumstances: Secondary | ICD-10-CM

## 2019-12-12 DIAGNOSIS — H938X3 Other specified disorders of ear, bilateral: Secondary | ICD-10-CM | POA: Diagnosis not present

## 2019-12-12 MED ORDER — FLUOCINOLONE ACETONIDE 0.01 % OT OIL: TOPICAL_OIL | OTIC | 0 refills | Status: DC

## 2019-12-12 NOTE — Progress Notes (Signed)
Erin Hart is a 34 y.o. female  Chief Complaint  Patient presents with  . Establish Care    Pt here for physical.  Pt will go to Dr. Tenny Craw for GYN.  Pt will reschedule labs.  Pt c/o ears have been irriated x 2-55months.  Pt need a referral to a vein specialist.    HPI: Erin Hart is a 34 y.o. female here as a new patient to establish care with our office.  She is here for annual CPE, not fasting for labs.  Pt works at [employer].   Last PAP: plans to establish with GYN Dr. Waynard Reeds [facility])  Diet/Exercise: overall healthy, eats in moderation; active and plays softball Dentist: due - pt will schedule Eye: no vision issues   She complains of B/L ear irritation x 2-3 mo. No pain. + ichy at times. No fever, chills. Slight decrease in hearing. No tinnitus.  Pt has h/o noise exposure - worked in a Holiday representative x 3 years  Pt notes family h/o varicose veins (mom & sister). No pain. No soreness. No edema.   History reviewed. No pertinent past medical history.  Past Surgical History:  Procedure Laterality Date  . ELBOW SURGERY Right 2005    Social History   Socioeconomic History  . Marital status: Single    Spouse name: Not on file  . Number of children: Not on file  . Years of education: Not on file  . Highest education level: Not on file  Occupational History  . Not on file  Tobacco Use  . Smoking status: Never Smoker  . Smokeless tobacco: Never Used  Substance and Sexual Activity  . Alcohol use: Yes    Comment: occasional  . Drug use: Never  . Sexual activity: Not on file  Other Topics Concern  . Not on file  Social History Narrative  . Not on file   Social Determinants of Health   Financial Resource Strain:   . Difficulty of Paying Living Expenses:   Food Insecurity:   . Worried About Programme researcher, broadcasting/film/video in the Last Year:   . Barista in the Last Year:   Transportation Needs:   . Freight forwarder (Medical):   Marland Kitchen Lack of  Transportation (Non-Medical):   Physical Activity:   . Days of Exercise per Week:   . Minutes of Exercise per Session:   Stress:   . Feeling of Stress :   Social Connections:   . Frequency of Communication with Friends and Family:   . Frequency of Social Gatherings with Friends and Family:   . Attends Religious Services:   . Active Member of Clubs or Organizations:   . Attends Banker Meetings:   Marland Kitchen Marital Status:   Intimate Partner Violence:   . Fear of Current or Ex-Partner:   . Emotionally Abused:   Marland Kitchen Physically Abused:   . Sexually Abused:     Family History  Problem Relation Age of Onset  . Diabetes Mother   . Thyroid disease Mother   . Thyroid disease Sister   . Lung cancer Maternal Grandfather   . Pancreatic cancer Paternal Grandfather       There is no immunization history on file for this patient.  Outpatient Encounter Medications as of 12/12/2019  Medication Sig  . ondansetron (ZOFRAN) 4 MG tablet Take 1 tablet (4 mg total) by mouth every 6 (six) hours. (Patient not taking: Reported on 12/12/2019)   No facility-administered  encounter medications on file as of 12/12/2019.     ROS: Gen: no fever, chills  Skin: no rash, itching ENT: no ear pain, ear drainage, nasal congestion, rhinorrhea, sinus pressure, sore throat Eyes: no blurry vision, double vision Resp: no cough, wheeze,SOB CV: no CP, palpitations, LE edema,  GI: no heartburn, n/v/d/c, abd pain GU: no dysuria, urgency, frequency, hematuria MSK: no joint pain, myalgias, back pain Neuro: no dizziness, headache, weakness, vertigo Psych: no depression, anxiety, insomnia   No Known Allergies  BP 90/72 (BP Location: Left Arm, Patient Position: Sitting, Cuff Size: Normal)   Pulse 74   Temp 98.5 F (36.9 C) (Temporal)   Ht 5' 5.75" (1.67 m)   Wt 139 lb 6.4 oz (63.2 kg)   LMP 11/27/2019   SpO2 99%   BMI 22.67 kg/m   Physical Exam Constitutional:      General: She is not in acute  distress.    Appearance: She is well-developed.  HENT:     Head: Normocephalic and atraumatic.     Right Ear: Tympanic membrane and ear canal normal.     Left Ear: Tympanic membrane and ear canal normal.     Nose: Nose normal.  Eyes:     Conjunctiva/sclera: Conjunctivae normal.     Pupils: Pupils are equal, round, and reactive to light.  Neck:     Thyroid: No thyromegaly.  Cardiovascular:     Rate and Rhythm: Normal rate and regular rhythm.     Heart sounds: Normal heart sounds. No murmur heard.   Pulmonary:     Effort: Pulmonary effort is normal. No respiratory distress.     Breath sounds: Normal breath sounds. No wheezing or rhonchi.  Abdominal:     General: Bowel sounds are normal. There is no distension.     Palpations: Abdomen is soft. There is no mass.     Tenderness: There is no abdominal tenderness.  Musculoskeletal:     Cervical back: Neck supple.  Lymphadenopathy:     Cervical: No cervical adenopathy.  Skin:    General: Skin is warm and dry.  Neurological:     Mental Status: She is alert and oriented to person, place, and time.     Motor: No abnormal muscle tone.     Coordination: Coordination normal.  Psychiatric:        Behavior: Behavior normal.      A/P: 1. Encounter to establish care with new doctor  2. Annual physical exam - Pap with GYN Dr. Tenny Craw - due for dental and pt will schedule - discussed importance of regular CV exercise, healthy diet, adequate sleep - ALT; Future - AST; Future - Basic metabolic panel; Future - CBC; Future - Lipid panel; Future - next CPE in 1 year  3. Irritation of ear, bilateral Rx:  - Fluocinolone Acetonide 0.01 % OIL; 2 gtts in B/L ears BID  Dispense: 20 mL; Refill: 0 - pt to use x 2 wks then stop  Discussed plan and reviewed medications with patient, including risks, benefits, and potential side effects. Pt expressed understand. All questions answered.   This visit occurred during the SARS-CoV-2 public health  emergency.  Safety protocols were in place, including screening questions prior to the visit, additional usage of staff PPE, and extensive cleaning of exam room while observing appropriate contact time as indicated for disinfecting solutions.

## 2019-12-12 NOTE — Patient Instructions (Addendum)
Health Maintenance Due  Topic Date Due  . COVID-19 Vaccine (1) Pt has not had. Never done  . HIV Screening Pt not fasting today. Never done  . TETANUS/TDAP Pt has not had. Never done  . PAP SMEAR-Modifier Pt will schedule with Dr. Tenny Craw.  [facility] 04/11/2017    No flowsheet data found.

## 2019-12-14 ENCOUNTER — Other Ambulatory Visit (INDEPENDENT_AMBULATORY_CARE_PROVIDER_SITE_OTHER): Payer: BC Managed Care – PPO

## 2019-12-14 ENCOUNTER — Encounter: Payer: Self-pay | Admitting: Family Medicine

## 2019-12-14 DIAGNOSIS — Z0000 Encounter for general adult medical examination without abnormal findings: Secondary | ICD-10-CM

## 2019-12-14 LAB — BASIC METABOLIC PANEL
BUN: 14 mg/dL (ref 6–23)
CO2: 25 mEq/L (ref 19–32)
Calcium: 8.7 mg/dL (ref 8.4–10.5)
Chloride: 103 mEq/L (ref 96–112)
Creatinine, Ser: 0.71 mg/dL (ref 0.40–1.20)
GFR: 95.25 mL/min (ref >60.00)
Glucose, Bld: 82 mg/dL (ref 70–99)
Potassium: 4.3 mEq/L (ref 3.5–5.1)
Sodium: 134 mEq/L — ABNORMAL LOW (ref 135–145)

## 2019-12-14 LAB — LIPID PANEL
Cholesterol: 119 mg/dL (ref 0–200)
HDL: 51.7 mg/dL (ref >39.00)
LDL Cholesterol: 57 mg/dL (ref 0–99)
NonHDL: 67.06
Total CHOL/HDL Ratio: 2
Triglycerides: 50 mg/dL (ref 0.0–149.0)
VLDL: 10 mg/dL (ref 0.0–40.0)

## 2019-12-14 LAB — CBC
HCT: 36.9 % (ref 36.0–46.0)
Hemoglobin: 12.5 g/dL (ref 12.0–15.0)
MCHC: 33.8 g/dL (ref 30.0–36.0)
MCV: 85 fl (ref 78.0–100.0)
Platelets: 186 10*3/uL (ref 150.0–400.0)
RBC: 4.34 Mil/uL (ref 3.87–5.11)
RDW: 12.8 % (ref 11.5–15.5)
WBC: 6.6 10*3/uL (ref 4.0–10.5)

## 2019-12-14 LAB — AST: AST: 13 U/L (ref 0–37)

## 2019-12-14 LAB — ALT: ALT: 10 U/L (ref 0–35)

## 2019-12-28 ENCOUNTER — Ambulatory Visit: Payer: Self-pay

## 2019-12-28 ENCOUNTER — Encounter: Payer: Self-pay | Admitting: Surgery

## 2019-12-28 ENCOUNTER — Ambulatory Visit (INDEPENDENT_AMBULATORY_CARE_PROVIDER_SITE_OTHER): Payer: BC Managed Care – PPO | Admitting: Surgery

## 2019-12-28 VITALS — BP 115/66 | HR 72 | Ht 65.75 in | Wt 139.4 lb

## 2019-12-28 DIAGNOSIS — M545 Low back pain, unspecified: Secondary | ICD-10-CM

## 2019-12-28 DIAGNOSIS — M7061 Trochanteric bursitis, right hip: Secondary | ICD-10-CM

## 2019-12-28 DIAGNOSIS — M461 Sacroiliitis, not elsewhere classified: Secondary | ICD-10-CM

## 2019-12-28 DIAGNOSIS — M25521 Pain in right elbow: Secondary | ICD-10-CM | POA: Diagnosis not present

## 2019-12-28 DIAGNOSIS — M7062 Trochanteric bursitis, left hip: Secondary | ICD-10-CM

## 2019-12-28 MED ORDER — METHYLPREDNISOLONE 4 MG PO TABS: ORAL_TABLET | ORAL | 0 refills | Status: DC

## 2019-12-28 MED ORDER — METHOCARBAMOL 500 MG PO TABS: 500.0000 mg | ORAL_TABLET | Freq: Three times a day (TID) | ORAL | 0 refills | Status: DC | PRN

## 2019-12-28 NOTE — Progress Notes (Signed)
Office Visit Note   Patient: Erin Hart           Date of Birth: May 29, 1986           MRN: [id number] Visit Date: 12/28/2019              Requested by: Ronnald Nian, DO [place],  [place] [address] PCP: Ronnald Nian, DO   Assessment & Plan: Visit Diagnoses:  1. Acute bilateral low back pain, unspecified whether sciatica present   2. SI (sacroiliac) joint inflammation (HCC)   3. Pain in right elbow   4. Greater trochanteric bursitis of both hips     Plan: With patient's multiple areas of pain elected to attempt conservative treatment with Medrol Dosepak 6-day taper to be taken as directed.  I also sent an prescription for Robaxin as needed for spasms.  Also with the patient's multiple areas of joint pain blood work was drawn to check a CBC and arthritis panel.  Patient given IT band stretching exercises and I also advised patient to work on hamstring stretching as well.  I discussed formal PT but I will have her do this for now and see how she responds.  Follow-up me in 3 weeks for recheck.  May consider referring patient to Dr. Junius Roads for evaluation of her right medial elbow pain and possible injection for medial epicondylitis.  Follow-Up Instructions: Return in about 3 weeks (around 01/18/2020) for with [facility] recheck.   Orders:  Orders Placed This Encounter  Procedures  . XR Lumbar Spine 2-3 Views  . CBC  . Sed Rate (ESR)  . Antinuclear Antib (ANA)  . Rheumatoid Factor  . Uric acid   Meds ordered this encounter  Medications  . methylPREDNISolone (MEDROL) 4 MG tablet    Sig: 6 day taper to be taken as directed    Dispense:  21 tablet    Refill:  0  . methocarbamol (ROBAXIN) 500 MG tablet    Sig: Take 1 tablet (500 mg total) by mouth every 8 (eight) hours as needed for muscle spasms.    Dispense:  30 tablet    Refill:  0      Procedures: No procedures performed   Clinical Data: No additional findings.   Subjective: Chief  Complaint  Patient presents with  . Left Hip - Pain    HPI 34 year old female presents movements of left hip pain x3 months.  Is also had some pain across the low back for a couple months.  Patient is very active of land softball and working out.  Pain when she is ambulating, sitting and standing.  No complaints of radicular pain down the legs.  No lower extremity numbness tingling.  She does use heat off and on with no relief.  No complaints of groin pain.  Patient also complains of right medial elbow pain.  More when she is gripping objects.  No complaints of upper extremity numbness and tingling. Review of Systems No current cardiac pulmonary GI GU issues  Objective: Vital Signs: BP 115/66   Pulse 72   Ht 5' 5.75" (1.67 m)   Wt 139 lb 6.4 oz (63.2 kg)   BMI 22.67 kg/m   Physical Exam Constitutional:      General: She is not in acute distress. HENT:     Head: Normocephalic and atraumatic.     Nose: Nose normal.  Eyes:     Extraocular Movements: Extraocular movements intact.     Pupils: Pupils  are equal, round, and reactive to light.  Pulmonary:     Effort: No respiratory distress.  Musculoskeletal:     Comments: Gait is normal.  Cervical spine good range of motion.  About elbows good range of motion.  Right elbow she has some tenderness over the medial condyle.  Negative Tinel's over the cubital tunnel.  She does have mild to moderate bilateral SI joint tenderness.  Left greater than right tenderness over the greater trochanter bursa and some tenderness extends down the course of the IT band.  Negative logroll bilateral hips.  Negative straight leg raise.  Tight hamstrings bilaterally.  No focal motor deficits.  Neurological:     General: No focal deficit present.     Mental Status: She is alert and oriented to person, place, and time.  Psychiatric:        Mood and Affect: Mood normal.     Ortho Exam  Specialty Comments:  No specialty comments available.  Imaging: No  results found.   PMFS History: Patient Active Problem List   Diagnosis Date Noted  . GERD 06/27/2010  . ABDOMINAL PAIN, LEFT UPPER QUADRANT 06/27/2010   History reviewed. No pertinent past medical history.  Family History  Problem Relation Age of Onset  . Diabetes Mother   . Thyroid disease Mother   . Thyroid disease Sister   . Lung cancer Maternal Grandfather   . Pancreatic cancer Paternal Grandfather     Past Surgical History:  Procedure Laterality Date  . ELBOW SURGERY Right 2005   Social History   Occupational History  . Not on file  Tobacco Use  . Smoking status: Never Smoker  . Smokeless tobacco: Never Used  Substance and Sexual Activity  . Alcohol use: Yes    Comment: occasional  . Drug use: Never  . Sexual activity: Not on file

## 2019-12-30 LAB — RHEUMATOID FACTOR: Rhuematoid fact SerPl-aCnc: <14 IU/mL (ref <14)

## 2019-12-30 LAB — URIC ACID: Uric Acid, Serum: 5.3 mg/dL (ref 2.5–7.0)

## 2019-12-30 LAB — CBC
HCT: 36.4 % (ref 35.0–45.0)
Hemoglobin: 12.4 g/dL (ref 11.7–15.5)
MCH: 28.7 pg (ref 27.0–33.0)
MCHC: 34.1 g/dL (ref 32.0–36.0)
MCV: 84.3 fL (ref 80.0–100.0)
MPV: 11.6 fL (ref 7.5–12.5)
Platelets: 283 10*3/uL (ref 140–400)
RBC: 4.32 10*6/uL (ref 3.80–5.10)
RDW: 12.5 % (ref 11.0–15.0)
WBC: 6.3 10*3/uL (ref 3.8–10.8)

## 2019-12-30 LAB — ANA: Anti Nuclear Antibody (ANA): NEGATIVE

## 2019-12-30 LAB — SEDIMENTATION RATE: Sed Rate: 9 mm/h (ref 0–20)

## 2020-01-31 ENCOUNTER — Ambulatory Visit: Payer: BC Managed Care – PPO | Admitting: Surgery

## 2020-01-31 ENCOUNTER — Encounter: Payer: Self-pay | Admitting: Surgery

## 2020-01-31 VITALS — BP 114/73 | HR 63 | Ht 65.75 in | Wt 139.4 lb

## 2020-01-31 DIAGNOSIS — M545 Low back pain: Secondary | ICD-10-CM | POA: Diagnosis not present

## 2020-01-31 DIAGNOSIS — M7062 Trochanteric bursitis, left hip: Secondary | ICD-10-CM | POA: Diagnosis not present

## 2020-01-31 DIAGNOSIS — G8929 Other chronic pain: Secondary | ICD-10-CM

## 2020-01-31 DIAGNOSIS — M7061 Trochanteric bursitis, right hip: Secondary | ICD-10-CM

## 2020-01-31 NOTE — Progress Notes (Signed)
   Office Visit Note   Patient: Erin Hart           Date of Birth: [birth year]           MRN: [id number] Visit Date: 01/31/2020              Requested by: Overton Mam, DO [address] [place],  [place] [address] PCP: Overton Mam, DO   Assessment & Plan: Visit Diagnoses:  1. Greater trochanteric bursitis of both hips   2. Chronic low back pain without sciatica, unspecified back pain laterality     Plan: For patients right medial elbow pain I will schedule an appointment see Dr. Prince Rome this Friday for ultrasound-guided medial epicondyle injection.  If she does not get good relief with this injection we may need to consider getting an MRI to better evaluate the tendon.  We will also schedule patient for formal PT for her low back pain and bilateral hip pain.  They can evaluate and treat as needed.  Follow-Up Instructions: Return for With Dr. Prince Rome Friday morning for ultrasound-guided elbow injection and Mounir Skipper 4 weeks for recheck.   Orders:  Orders Placed This Encounter  Procedures  . Ambulatory referral to Physical Therapy   No orders of the defined types were placed in this encounter.     Procedures: No procedures performed   Clinical Data: No additional findings.   Subjective: Chief Complaint  Patient presents with  . Left Hip - Follow-up    HPI Patient states that she had slight improvement in her multiple areas addressed after taking the prednisone taper.  Continues have low back pain and bilateral lateral hip pain.  Also pain at the right medial elbow epicondyle.  Lab work was unremarkable.  Denies any lower extremity radiculopathy. Review of Systems No current cardiac pulmonary GI GU issues  Objective: Vital Signs: BP 114/73   Pulse 63   Ht 5' 5.75" (1.67 m)   Wt 139 lb 6.4 oz (63.2 kg)   BMI 22.67 kg/m   Physical Exam Gait is normal.  Still has low back pain with lumbar extension.  Tender over the SI joints.  Bilateral hip greater  trochanter bursa tender.  Negative logroll.  Negative straight leg raise. Ortho Exam  Specialty Comments:  No specialty comments available.  Imaging: No results found.   PMFS History: Patient Active Problem List   Diagnosis Date Noted  . GERD 06/27/2010  . ABDOMINAL PAIN, LEFT UPPER QUADRANT 06/27/2010   History reviewed. No pertinent past medical history.  Family History  Problem Relation Age of Onset  . Diabetes Mother   . Thyroid disease Mother   . Thyroid disease Sister   . Lung cancer Maternal Grandfather   . Pancreatic cancer Paternal Grandfather     Past Surgical History:  Procedure Laterality Date  . ELBOW SURGERY Right 2005   Social History   Occupational History  . Not on file  Tobacco Use  . Smoking status: Never Smoker  . Smokeless tobacco: Never Used  Substance and Sexual Activity  . Alcohol use: Yes    Comment: occasional  . Drug use: Never  . Sexual activity: Not on file

## 2020-02-02 ENCOUNTER — Ambulatory Visit: Payer: BC Managed Care – PPO | Admitting: Family Medicine

## 2020-02-02 ENCOUNTER — Other Ambulatory Visit: Payer: Self-pay

## 2020-02-02 ENCOUNTER — Encounter: Payer: Self-pay | Admitting: Family Medicine

## 2020-02-02 ENCOUNTER — Ambulatory Visit: Payer: Self-pay

## 2020-02-02 DIAGNOSIS — M25521 Pain in right elbow: Secondary | ICD-10-CM

## 2020-02-02 NOTE — Progress Notes (Signed)
   Office Visit Note   Patient: Erin Hart           Date of Birth: 1986-04-20           MRN: [id number] Visit Date: 02/02/2020 Requested by: Overton Mam, DO [address] [place],  [place] [address] PCP: Overton Mam, DO  Subjective: Chief Complaint  Patient presents with  . Right Elbow - Pain    Medial elbow pain - cortisone injection per Fayrene Fearing    HPI: He is here with right elbow pain for a planned medial epicondyle injection.  Pain for the past 4 or 5 months, no injury.  She is doing a renovation at her house and a lot of repetitive work.  Denies any numbness or tingling.              ROS:   All other systems were reviewed and are negative.  Objective: Vital Signs: There were no vitals taken for this visit.  Physical Exam:  General:  Alert and oriented, in no acute distress. Pulm:  Breathing unlabored. Psy:  Normal mood, congruent affect. Skin: No erythema or rash Right elbow: Full range of motion, no elbow effusion.  She is point tender at the medial epicondyle.  Negative Tinel's at the ulnar groove, no subluxation of the ulnar nerve detected.  She has pain with forearm pronation against resistance.  She has good strength.  Imaging: US Guided Needle Placement - No Linked Charges  Result Date: 02/02/2020 Right elbow medial epicondyle injection: After sterile prep with Betadine, injected 2 cc 1% lidocaine without epinephrine and 40 mg methylprednisolone into the insertion of the common flexor tendon.  She had complete pain relief during the immediate anesthetic phase.   Assessment & Plan: 1.  Chronic right elbow medial epicondylitis -Injection as above.  Follow-up with me as needed.     Procedures: No procedures performed  No notes on file     PMFS History: Patient Active Problem List   Diagnosis Date Noted  . GERD 06/27/2010  . ABDOMINAL PAIN, LEFT UPPER QUADRANT 06/27/2010   History reviewed. No pertinent past medical history.  Family  History  Problem Relation Age of Onset  . Diabetes Mother   . Thyroid disease Mother   . Thyroid disease Sister   . Lung cancer Maternal Grandfather   . Pancreatic cancer Paternal Grandfather     Past Surgical History:  Procedure Laterality Date  . ELBOW SURGERY Right 2005   Social History   Occupational History  . Not on file  Tobacco Use  . Smoking status: Never Smoker  . Smokeless tobacco: Never Used  Substance and Sexual Activity  . Alcohol use: Yes    Comment: occasional  . Drug use: Never  . Sexual activity: Not on file

## 2020-02-13 ENCOUNTER — Other Ambulatory Visit: Payer: Self-pay

## 2020-02-13 ENCOUNTER — Ambulatory Visit: Payer: BC Managed Care – PPO | Attending: Surgery | Admitting: Physical Therapy

## 2020-02-13 ENCOUNTER — Encounter: Payer: Self-pay | Admitting: Physical Therapy

## 2020-02-13 DIAGNOSIS — M5440 Lumbago with sciatica, unspecified side: Secondary | ICD-10-CM | POA: Diagnosis present

## 2020-02-13 DIAGNOSIS — M6281 Muscle weakness (generalized): Secondary | ICD-10-CM | POA: Insufficient documentation

## 2020-02-13 DIAGNOSIS — G8929 Other chronic pain: Secondary | ICD-10-CM | POA: Insufficient documentation

## 2020-02-14 NOTE — Therapy (Signed)
[facility] [address] [place], [place], [address] Phone: [phone]   Fax:  [phone]  Physical Therapy Evaluation  Patient Details  Name: Robertta Halfhill MRN: [id number] Date of Birth: 10-24-1985 Referring Provider (PT): Zonia Kief PA-C   Encounter Date: 02/13/2020   PT End of Session - 02/13/20 1812    Visit Number 1    Number of Visits 7    Date for PT Re-Evaluation 03/30/20    Authorization Type BCBS, 35 VL    PT Start Time 1802    PT Stop Time 1853    PT Time Calculation (min) 51 min    Activity Tolerance Patient tolerated treatment well    Behavior During Therapy [facility] for tasks assessed/performed           History reviewed. No pertinent past medical history.  Past Surgical History:  Procedure Laterality Date  . ELBOW SURGERY Right 2005    There were no vitals filed for this visit.    Subjective Assessment - 02/13/20 1806    Subjective About 6 months began noticing low back. Have been renovating the whole year. About 6 mo ago was spending the day insulating the attic. Was working a strenuous job and began feeling like my hips are on fire bc of standing all day. Now I work for [employer] and sit all day.    Patient Stated Goals play softball. sit at work    Currently in Pain? Yes    Pain Score 5     Pain Location Back    Pain Orientation Lower;Mid    Pain Descriptors / Indicators Burning    Pain Radiating Towards bil hips    Aggravating Factors  constant pain but increased with bending/extending    Pain Relieving Factors figure 4 & hamstring stretch              [facility] PT Assessment - 02/14/20 0001      Assessment   Medical Diagnosis LBP. bil hip pain    Referring Provider (PT) Zonia Kief PA-C    Onset Date/Surgical Date --   6 months   Hand Dominance Right    Prior Therapy no      Precautions   Precautions None      Restrictions   Weight Bearing Restrictions No      Home  Environment   Living Environment Private residence      Prior Function   Level of Independence Independent    Vocation Full time employment    Vocation Requirements seated all day    Leisure softball, renovations      Cognition   Overall Cognitive Status Within Functional Limits for tasks assessed      Observation/Other Assessments   Focus on Therapeutic Outcomes (FOTO)  33% limited Low back, 19% limited hips      Sensation   Additional Comments WFL      Posture/Postural Control   Posture Comments lumbar destroscoliosis with Left pelvic elevation, LLE varus      Strength   Right Hip ABduction 4/5    Left Hip ABduction 4/5      Flexibility   Soft Tissue Assessment /Muscle Length --   limited bilat along LE biomechanical chain     Palpation   Palpation comment 83cm on Rt 84 cm on Lt      Ambulation/Gait   Gait Comments femoral IR at heel strike  Objective measurements completed on examination: See above findings.       [facility] Adult PT Treatment/Exercise - 02/14/20 0001      Exercises   Exercises --   HEP reviewed                 PT Education - 02/14/20 1250    Education Details anatomy of condition, POC, HEP, exercise form/rationale, FOTO    Person(s) Educated Patient    Methods Explanation;Demonstration;Tactile cues;Verbal cues;Handout    Comprehension Verbalized understanding;Returned demonstration;Verbal cues required;Tactile cues required;Need further instruction            PT Short Term Goals - 02/14/20 1302      PT SHORT TERM GOAL #1   Title pt will verbalize complaince with HEP on a daily basis    Baseline began creating at eval    Time 3    Period Weeks    Status New    Target Date 03/09/20             PT Long Term Goals - 02/14/20 1300      PT LONG TERM GOAL #1   Title independent with long term stretching regimen that she can use at home and at work through her day    Baseline began creating at  eval    Time 6    Period Weeks    Status New    Target Date 03/30/20      PT LONG TERM GOAL #2   Title hip abd to 5/5    Baseline 4/5 at eval    Time 6    Period Weeks    Status New    Target Date 03/30/20      PT LONG TERM GOAL #3   Title pt will be able to play softball without limitaiton by pain    Baseline uncomfortable at eval    Time 6    Period Weeks    Status New    Target Date 03/30/20      PT LONG TERM GOAL #4   Title pt will be able to tolerate positions required of her at work with knowledge of self care to keep pain <=2/10    Baseline significant increases in pain through the day at eval    Time 6    Period Weeks    Status New    Target Date 03/30/20                  Plan - 02/13/20 1821    Clinical Impression Statement 2 layer lift in Rt shoe.Pt presents toPT with complaints of LBP that began about 6 mo ago while she was placing insulation in her attic. She is currently renovating a house, working full time and plays softball. LLD measured and addressed with 2 layer lift in Rt shoe which pt denied increase in pain- I asked her to wear this for 48 hrs and then let me know how she is feeling. Pt has very tight hamstrings and weakness in hip abductors resulting in LE IR at heel strike in gait pulling from SIJs. Due to work schedule, pt is limited in attending sessions but we will do 1/week at this time and work with her as best as we can to meet functional goals. she recently began this job and does not feel it is appropriate to ask for a standing desk at this time.    Personal Factors and Comorbidities Time since onset of injury/illness/exacerbation;Profession    Examination-Activity  Limitations Locomotion Level;Transfers;Bend;Sit;Carry;Squat;Stairs;Stand;Lift    Examination-Participation Restrictions Cleaning;Yard Work;Occupation;Community Activity;Driving    Stability/Clinical Decision Making Stable/Uncomplicated    Clinical Decision Making Moderate     Rehab Potential Good    PT Frequency 1x / week    PT Duration 6 weeks    PT Treatment/Interventions ADLs/Self Care Home Management;Cryotherapy;Electrical Stimulation;Ultrasound;Traction;Moist Heat;Iontophoresis 4mg /ml Dexamethasone;Gait training;Stair training;Functional mobility training;Therapeutic activities;Therapeutic exercise;Patient/family education;Neuromuscular re-education;Manual techniques;Taping;Dry needling;Passive range of motion;Spinal Manipulations;Joint Manipulations    PT Next Visit Plan outcome of heel lift?, DN, hip abd strengthening    PT Home Exercise Plan [email or web address]    Consulted and Agree with Plan of Care Patient           Patient will benefit from skilled therapeutic intervention in order to improve the following deficits and impairments:  Abnormal gait, Pain, Improper body mechanics, Postural dysfunction, Decreased activity tolerance, Decreased strength, Impaired flexibility  Visit Diagnosis: Chronic bilateral low back pain with sciatica, sciatica laterality unspecified - Plan: PT plan of care cert/re-cert  Muscle weakness (generalized) - Plan: PT plan of care cert/re-cert     Problem List Patient Active Problem List   Diagnosis Date Noted  . GERD 06/27/2010  . ABDOMINAL PAIN, LEFT UPPER QUADRANT 06/27/2010    [name] C. Chiamaka Latka PT, DPT 02/14/20 1:06 PM   [facility] [address] [place], [place], [place] Phone: [phone]   Fax:  [phone]  Name: Phila Shoaf MRN: Revonda Standard Date of Birth: 08-07-1985  What was this (referring dx) caused by? []  Surgery []  Fall [x]  Ongoing issue []  Arthritis [x]  Other: __ongoing postural anomalies along with activity-related injury__________  Laterality: []  Rt []  Lt [x]  Both  Check all possible CPT codes:      []  97110 (Therapeutic Exercise)  []  92507 (SLP Treatment)  []  97112 (Neuro Re-ed)   []  92526 (Swallowing Treatment)   []  97116 (Gait  Training)   []  11/[birth year]/1987 (Cognitive Training, 1st 15 minutes) []  97140 (Manual Therapy)   []  97130 (Cognitive Training, each add'l 15 minutes)  []  97530 (Therapeutic Activities)  []  Other, List CPT Code ____________    []  97535 (Self Care)       [x]  All codes above (97110 - 97535)  [x]  97012 (Mechanical Traction)  [x]  97014 (E-stim Unattended)  []  97032 (E-stim manual)  [x]  97033 (Ionto)  [x]  97035 (Ultrasound)  []  97760 (Orthotic Fit) []  (Physical Performance Training) []  (Aquatic Therapy) []  97034 (Contrast Bath) []  (Paraffin) []  97597 (Wound Care 1st 20 sq cm) []  97598 (Wound Care each add'l 20 sq cm)

## 2020-02-20 ENCOUNTER — Ambulatory Visit: Payer: BC Managed Care – PPO | Admitting: Physical Therapy

## 2020-02-20 ENCOUNTER — Other Ambulatory Visit: Payer: Self-pay

## 2020-02-20 ENCOUNTER — Encounter: Payer: Self-pay | Admitting: Physical Therapy

## 2020-02-20 DIAGNOSIS — M5440 Lumbago with sciatica, unspecified side: Secondary | ICD-10-CM

## 2020-02-20 DIAGNOSIS — M5441 Lumbago with sciatica, right side: Secondary | ICD-10-CM

## 2020-02-20 DIAGNOSIS — M6281 Muscle weakness (generalized): Secondary | ICD-10-CM

## 2020-02-20 NOTE — Therapy (Addendum)
[facility] [place], [place], [address] Phone: [phone]   Fax:  [phone]  Physical Therapy Treatment/Discharge  Patient Details  Name: Erin Hart MRN: [id number] Date of Birth: [birth year] Referring Provider (PT): Benjiman Core PA-C   Encounter Date: 02/20/2020   PT End of Session - 02/20/20 1801    Visit Number 2    Number of Visits 7    Date for PT Re-Evaluation 03/30/20    Authorization Type BCBS, 35 VL    PT Start Time 1801    PT Stop Time 1842    PT Time Calculation (min) 41 min    Activity Tolerance Patient tolerated treatment well    Behavior During Therapy [facility] for tasks assessed/performed           History reviewed. No pertinent past medical history.  Past Surgical History:  Procedure Laterality Date  . ELBOW SURGERY Right 2005    There were no vitals filed for this visit.   Subjective Assessment - 02/20/20 1803    Subjective I don't feel like anything got worse. I have ups and downs depending on what I am doing. I am getting used to the wedge. I went without the wedge for a couple of hours without the wedge and didn't notice much difference.    Currently in Pain? Yes    Pain Score 5     Pain Location Back    Pain Orientation Mid;Lower    Pain Descriptors / Indicators Tightness                             [facility] Adult PT Treatment/Exercise - 02/20/20 0001      Exercises   Exercises Lumbar      Lumbar Exercises: Stretches   Passive Hamstring Stretch Right;Left;2 reps;30 seconds    Other Lumbar Stretch Exercise child pose center & SB      Lumbar Exercises: Supine   AB Set Limitations ab set series: marching, leg extension, TT taps      Lumbar Exercises: Prone   Other Prone Lumbar Exercises planks- knees/elbows, knees/toes      Manual Therapy   Manual Therapy Soft tissue mobilization    Manual therapy comments skilled palpation and monitoring during TPDN    Soft  tissue mobilization lumbar paraspinals            Trigger Point Dry Needling - 02/20/20 0001    Consent Given? Yes    Education Handout Provided --   verbal education   Muscles Treated Back/Hip Gluteus maximus    Other Dry Needling lumbar paraspinals L6, L5    Gluteus Maximus Response Twitch response elicited;Palpable increased muscle length   Rt               PT Education - 02/20/20 1930    Education Details DN & expected outcomes, heel lift, xrays    Person(s) Educated Patient    Methods Explanation;Handout    Comprehension Verbalized understanding;Need further instruction            PT Short Term Goals - 02/14/20 1302      PT SHORT TERM GOAL #1   Title pt will verbalize complaince with HEP on a daily basis    Baseline began creating at eval    Time 3    Period Weeks    Status New    Target Date 03/09/20  PT Long Term Goals - 02/14/20 1300      PT LONG TERM GOAL #1   Title independent with long term stretching regimen that she can use at home and at work through her day    Baseline began creating at eval    Time 6    Period Weeks    Status New    Target Date 03/30/20      PT LONG TERM GOAL #2   Title hip abd to 5/5    Baseline 4/5 at eval    Time 6    Period Weeks    Status New    Target Date 03/30/20      PT LONG TERM GOAL #3   Title pt will be able to play softball without limitaiton by pain    Baseline uncomfortable at eval    Time 6    Period Weeks    Status New    Target Date 03/30/20      PT LONG TERM GOAL #4   Title pt will be able to tolerate positions required of her at work with knowledge of self care to keep pain <=2/10    Baseline significant increases in pain through the day at eval    Time 6    Period Weeks    Status New    Target Date 03/30/20                 Plan - 02/20/20 1848    Clinical Impression Statement good tolerance to DN with reports of "feeling different" at lower lumbar, Rt paraspinals.  focus was to strengthen core for lumbopelvic stability today. 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 will focus on hip abd strengthening at next visit.    PT Treatment/Interventions ADLs/Self Care Home Management;Cryotherapy;Electrical Stimulation;Ultrasound;Traction;Moist Heat;Iontophoresis 1m/ml Dexamethasone;Gait training;Stair training;Functional mobility training;Therapeutic activities;Therapeutic exercise;Patient/family education;Neuromuscular re-education;Manual techniques;Taping;Dry needling;Passive range of motion;Spinal Manipulations;Joint Manipulations    PT Next Visit Plan lift v no lift, DN PRN. hip abd strength    PT Home Exercise Plan [email or web address]    Consulted and Agree with Plan of Care Patient           Patient will benefit from skilled therapeutic intervention in order to improve the following deficits and impairments:  Abnormal gait, Pain, Improper body mechanics, Postural dysfunction, Decreased activity tolerance, Decreased strength, Impaired flexibility  Visit Diagnosis: Chronic bilateral low back pain with sciatica, sciatica laterality unspecified  Muscle weakness (generalized)     Problem List Patient Active Problem List   Diagnosis Date Noted  . GERD 06/27/2010  . ABDOMINAL PAIN, LEFT UPPER QUADRANT 06/27/2010    Jaeley Wiker C. Italo Banton PT, DPT 02/20/20 7:30 PM   [place] [address]: [phone]  Fax:  [phone] Name: Erin PresswoodMRN: [id number] of Birth: 406/02/1986 PHYSICAL THERAPY DISCHARGE SUMMARY  Visits from Start of Care: 2  Current functional level related to goals / functional outcomes: See above   Remaining deficits: See above   Education / Equipment: Anatomy of condition, POC, HEP, exercise form/rationale  Plan: Patient agrees to discharge.  Patient goals were not met. Patient is being discharged due  to the patient's request.  ?????    Pt called to request d/c- changing course of treatment.  Erin Hart C. Erin Hart PT, DPT 04/08/20 2:48 PM

## 2020-02-29 ENCOUNTER — Ambulatory Visit: Payer: BC Managed Care – PPO

## 2020-03-07 ENCOUNTER — Ambulatory Visit: Payer: BC Managed Care – PPO | Admitting: Surgery

## 2020-03-09 ENCOUNTER — Ambulatory Visit: Payer: BC Managed Care – PPO | Admitting: Physical Therapy

## 2020-03-16 ENCOUNTER — Ambulatory Visit: Payer: BC Managed Care – PPO | Admitting: Physical Therapy

## 2020-03-20 ENCOUNTER — Ambulatory Visit: Payer: BC Managed Care – PPO | Admitting: Physical Therapy

## 2020-03-26 ENCOUNTER — Encounter: Payer: BC Managed Care – PPO | Admitting: Physical Therapy

## 2021-07-23 ENCOUNTER — Other Ambulatory Visit: Payer: Self-pay

## 2021-07-23 ENCOUNTER — Ambulatory Visit
Admission: RE | Admit: 2021-07-23 | Discharge: 2021-07-23 | Disposition: A | Discharge status: Home / Self Care | Payer: No Typology Code available for payment source | Source: Ambulatory Visit | Attending: Emergency Medicine | Admitting: Emergency Medicine

## 2021-07-23 VITALS — BP 121/83 | HR 81 | Temp 98.6°F | Resp 18

## 2021-07-23 DIAGNOSIS — J988 Other specified respiratory disorders: Secondary | ICD-10-CM | POA: Insufficient documentation

## 2021-07-23 DIAGNOSIS — J029 Acute pharyngitis, unspecified: Secondary | ICD-10-CM | POA: Diagnosis not present

## 2021-07-23 LAB — POCT RAPID STREP A (OFFICE): Rapid Strep A Screen: NEGATIVE

## 2021-07-23 MED ORDER — CEFDINIR 300 MG PO CAPS: 300.0000 mg | ORAL_CAPSULE | Freq: Two times a day (BID) | ORAL | 0 refills | Status: AC

## 2021-07-23 MED ORDER — OSELTAMIVIR PHOSPHATE 75 MG PO CAPS: 75.0000 mg | ORAL_CAPSULE | Freq: Two times a day (BID) | ORAL | 0 refills | Status: AC

## 2021-07-23 NOTE — ED Provider Notes (Signed)
[facility]    CSN: [id number] Arrival date & time: 07/23/21  1256    HISTORY   Chief Complaint  Patient presents with   Sore Throat   HPI Erin Hart is a 36 y.o. female. Patient presents to urgent care today for evaluation of sore throat that began yesterday.  Patient also complains of pressure in her head.  Patient reports a history of exposure to strep pharyngitis this past weekend.  Patient states she never had strep before.  Patient states has not tried any medications for her symptoms at this time.  Patient denies cough, congestion, ear pain, runny nose, nausea, vomiting, diarrhea, fever, body ache.  Patient states the pressure in her head is only left-sided and feels a little bit like sinus pressure.  Patient denies loss of taste or smell.   The history is provided by the patient.  History reviewed. No pertinent past medical history. Patient Active Problem List   Diagnosis Date Noted   GERD 06/27/2010   ABDOMINAL PAIN, LEFT UPPER QUADRANT 06/27/2010   Past Surgical History:  Procedure Laterality Date   ELBOW SURGERY Right 2005   OB History   No obstetric history on file.    Home Medications    Prior to Admission medications   Not on File   Family History Family History  Problem Relation Age of Onset   Diabetes Mother    Thyroid disease Mother    Thyroid disease Sister    Lung cancer Maternal Grandfather    Pancreatic cancer Paternal Grandfather    Social History Social History   Tobacco Use   Smoking status: Never   Smokeless tobacco: Never  Substance Use Topics   Alcohol use: Yes    Comment: occasional   Drug use: Never   Allergies   Patient has no known allergies.  Review of Systems Review of Systems Pertinent findings noted in history of present illness.   Physical Exam Triage Vital Signs ED Triage Vitals  Enc Vitals Group     BP 04/04/21 0827 (!) 147/82     Pulse Rate 04/04/21 0827 72     Resp 04/04/21 0827 18     Temp  04/04/21 0827 98.3 F (36.8 C)     Temp Source 04/04/21 0827 Oral     SpO2 04/04/21 0827 98 %     Weight --      Height --      Head Circumference --      Peak Flow --      Pain Score 04/04/21 0826 5     Pain Loc --      Pain Edu? --      Excl. in GC? --   No data found.  Updated Vital Signs BP 121/83 (BP Location: Right Arm)    Pulse 81    Temp 98.6 F (37 C) (Oral)    Resp 18    LMP 06/25/2021 (Approximate)    SpO2 98%   Physical Exam Constitutional:      General: She is not in acute distress.    Appearance: She is well-developed. She is ill-appearing. She is not toxic-appearing.  HENT:     Head: Normocephalic and atraumatic.     Salivary Glands: Right salivary gland is not diffusely enlarged or tender. Left salivary gland is not diffusely enlarged or tender.     Right Ear: Hearing, tympanic membrane, ear canal and external ear normal.     Left Ear: Hearing, tympanic membrane, ear canal and external  ear normal.     Ears:     Comments: Bilateral EACs with mild erythema, bilateral TMs are normal    Nose: Congestion and rhinorrhea present. No mucosal edema. Rhinorrhea is clear.     Right Turbinates: Not enlarged, swollen or pale.     Left Turbinates: Not enlarged or swollen.     Right Sinus: No maxillary sinus tenderness or frontal sinus tenderness.     Left Sinus: No maxillary sinus tenderness or frontal sinus tenderness.     Mouth/Throat:     Lips: Pink. No lesions.     Mouth: Mucous membranes are moist. No oral lesions or angioedema.     Dentition: No gingival swelling.     Tongue: No lesions.     Palate: No mass.     Pharynx: Uvula midline. Pharyngeal swelling, posterior oropharyngeal erythema and uvula swelling present. No oropharyngeal exudate.     Tonsils: No tonsillar exudate. 1+ on the right. 1+ on the left.  Eyes:     Extraocular Movements: Extraocular movements intact.     Conjunctiva/sclera: Conjunctivae normal.     Pupils: Pupils are equal, round, and reactive  to light.  Neck:     Thyroid: No thyroid mass, thyromegaly or thyroid tenderness.     Trachea: Tracheal tenderness present. No abnormal tracheal secretions or tracheal deviation.     Comments: Voice is muffled Cardiovascular:     Rate and Rhythm: Normal rate and regular rhythm.     Pulses: Normal pulses.     Heart sounds: Normal heart sounds, S1 normal and S2 normal. No murmur heard.   No friction rub. No gallop.  Pulmonary:     Effort: Pulmonary effort is normal. No accessory muscle usage, prolonged expiration, respiratory distress or retractions.     Breath sounds: No stridor, decreased air movement or transmitted upper airway sounds. No decreased breath sounds, wheezing, rhonchi or rales.     Comments: Turbulent breath sounds throughout without wheeze, rale, rhonchi. Abdominal:     General: Abdomen is flat. Bowel sounds are normal.     Palpations: Abdomen is soft.     Tenderness: There is generalized abdominal tenderness. There is no right CVA tenderness, left CVA tenderness or rebound. Negative signs include Murphy's sign.     Hernia: No hernia is present.  Musculoskeletal:        General: No tenderness. Normal range of motion.     Cervical back: Full passive range of motion without pain, normal range of motion and neck supple.     Right lower leg: No edema.     Left lower leg: No edema.  Lymphadenopathy:     Cervical: Cervical adenopathy present.     Right cervical: Superficial cervical adenopathy and posterior cervical adenopathy present.     Left cervical: Superficial cervical adenopathy and posterior cervical adenopathy present.  Skin:    General: Skin is warm and dry.     Findings: No erythema, lesion or rash.  Neurological:     General: No focal deficit present.     Mental Status: She is alert and oriented to person, place, and time. Mental status is at baseline.     Motor: Motor function is intact.     Coordination: Coordination is intact.     Gait: Gait is intact.      Deep Tendon Reflexes: Reflexes are normal and symmetric.  Psychiatric:        Attention and Perception: Attention and perception normal.  Mood and Affect: Mood and affect normal.        Speech: Speech normal.        Behavior: Behavior normal. Behavior is cooperative.        Thought Content: Thought content normal.        Judgment: Judgment normal.    Visual Acuity Right Eye Distance:   Left Eye Distance:   Bilateral Distance:    Right Eye Near:   Left Eye Near:    Bilateral Near:     [facility] Couse / Diagnostics / Procedures:    EKG  Radiology No results found.  Procedures Procedures (including critical care time)  UC Diagnoses / Final Clinical Impressions(s)   I have reviewed the triage vital signs and the nursing notes.  Pertinent labs & imaging results that were available during my care of the patient were reviewed by me and considered in my medical decision making (see chart for details).   Final diagnoses:  Acute pharyngitis, unspecified etiology  Respiratory infection   Patient presents with subjective symptoms and physical exam findings of COVID, flu and strep pharyngitis.  Rapid strep test today is negative.  COVID/flu testing is in process.  Patient provided with antibiotics and Tamiflu to treat her empirically for flu and strep pharyngitis.  Return precautions advised.  Plan of care advised.  ED Prescriptions     Medication Sig Dispense Auth. Provider   cefdinir (OMNICEF) 300 MG capsule Take 1 capsule (300 mg total) by mouth 2 (two) times daily for 10 days. 20 capsule Theadora RamaMorgan, Lydie Stammen Scales, PA-C   oseltamivir (TAMIFLU) 75 MG capsule Take 1 capsule (75 mg total) by mouth every 12 (twelve) hours. 10 capsule Theadora RamaMorgan, Lexii Walsh Scales, PA-C      PDMP not reviewed this encounter.  Pending results:  Labs Reviewed  CULTURE, GROUP A STREP ([facility])  COVID-19, FLU A+B NAA  POCT RAPID STREP A (OFFICE)    Medications Ordered in UC: Medications - No data to  display  Disposition Upon Discharge:  Condition: stable for discharge home Home: take medications as prescribed; routine discharge instructions as discussed; follow up as advised.  Patient presented with an acute illness with associated systemic symptoms and significant discomfort requiring urgent management. In my opinion, this is a condition that a prudent lay person (someone who possesses an average knowledge of health and medicine) may potentially expect to result in complications if not addressed urgently such as respiratory distress, impairment of bodily function or dysfunction of bodily organs.   Routine symptom specific, illness specific and/or disease specific instructions were discussed with the patient and/or caregiver at length.   As such, the patient has been evaluated and assessed, work-up was performed and treatment was provided in alignment with urgent care protocols and evidence based medicine.  Patient/parent/caregiver has been advised that the patient may require follow up for further testing and treatment if the symptoms continue in spite of treatment, as clinically indicated and appropriate.  If the patient was tested for COVID-19, Influenza and/or RSV, then the patient/parent/guardian was advised to isolate at home pending the results of his/her diagnostic coronavirus test and potentially longer if theyre positive. I have also advised pt that if his/her COVID-19 test returns positive, it's recommended to self-isolate for at least 10 days after symptoms first appeared AND until fever-free for 24 hours without fever reducer AND other symptoms have improved or resolved. Discussed self-isolation recommendations as well as instructions for household member/close contacts as per the CDC and [place] [facility], and also  gave patient the COVID packet with this information.  Patient/parent/caregiver has been advised [facility] in 3-5 days if no better; to PCP or the Emergency  Department if new signs and symptoms develop, or if the current signs or symptoms continue to change or worsen for further workup, evaluation and treatment as clinically indicated and appropriate  The patient will follow up with their current PCP if and as advised. If the patient does not currently have a PCP we will assist them in obtaining one.   The patient may need specialty follow up if the symptoms continue, in spite of conservative treatment and management, for further workup, evaluation, consultation and treatment as clinically indicated and appropriate.  Patient/parent/caregiver verbalized understanding and agreement of plan as discussed.  All questions were addressed during visit.  Please see discharge instructions below for further details of plan.  Discharge Instructions:   Discharge Instructions      You were tested for both COVID and influenza today.  The result of your viral testing will be posted to your MyChart once it is complete, this typically takes 24 to 48 hours.  If there is a positive result, you will be contacted by phone with further recommendations, if any.    I recommend that you begin taking Tamiflu now for empiric treatment of presumed influenza based on the history provided to me today along with my physical exam findings. Tamiflu is an antiviral medication that decreases the severity, duration and transmissibility of influenza virus by preventing the virus from reproducing itself in your body.     If the influenza result is positive, please continue the full 5-day course of Tamiflu.  If the result is negative, please feel free to discontinue Tamiflu if you prefer but do keep in mind that Tamiflu can also be taken preventatively.  Finishing the full 5-day course will decrease the chances of catching influenza from anyone else and will not cause harm otherwise.     Based on my physical exam today and the history that you provided, I also believe that you may have  bacterial pharyngitis.  Your rapid strep test today, which is not a very good test, was negative.   Throat culture will be performed per protocol.   Because of your exposure to strep pharyngitis and the acute onset of your symptoms, as well as the redness and swelling in your throat, I also recommend that you begin taking antibiotics for empiric treatment of bacterial pharyngitis.  I have prescribed Cefdinir 300 mg twice daily for 10 days to treat you for bacterial pharyngitis.  Cefdinir covers group A strep along with other known causative organisms.  Please take this antibiotic as prescribed and do not skip any doses.  Once you have been on antibiotics for a full 24 hours, you are no longer considered contagious.       If you receive a phone call telling you that your throat culture is negative but you are feeling significantly better after the first 48 hours of therapy, please feel free to finish the full 10-day course as it is likely bacterial pharyngitis caused by a bacterial organism other than strep.  Our culture here only tests for strep.       Please see the list below for other recommended medications, dosages and frequencies to provide relief of your current symptoms:     Ibuprofen  (Advil, Motrin): This is a good anti-inflammatory medication which addresses aches, pains and inflammation of the  upper airways that causes sinus and nasal congestion as well as in the lower airways which makes your cough feel tight and sometimes burn.  I recommend that you take between 400 to 600 mg every 6-8 hours as needed.      Acetaminophen (Tylenol): This is a good fever reducer.  If your body temperature rises above 101.5 as measured with a thermometer, it is recommended that you take 1,000 mg every 8 hours until your temperature falls below 101.5, please not take more than 3,000 mg of acetaminophen either as a separate medication or as in ingredient in an over-the-counter cold/flu preparation within a 24-hour  period.      Chloraseptic Throat Spray: Spray 5 sprays into affected area every 2 hours, hold for 15 seconds and either swallow or spit it out.  This is a excellent numbing medication because it is a spray, you can put it right where you needed and so sucking on a lozenge and numbing your entire mouth.      Conservative care is also recommended at this time.  This includes rest, pushing clear fluids and activity as tolerated.  Warm beverages such as teas and broths versus cold beverages/popsicles and frozen sherbet/sorbet are your choice, both warm and cold are beneficial.  You may also notice that your appetite is reduced; this is okay as long as you are drinking plenty of clear fluids.    Please follow-up within the next 3 to 5 days either with your primary care provider or urgent care if your symptoms do not resolve.  If you do not have a primary care provider, we will assist you in finding one.  Thank you for visiting urgent care today.  I appreciate the opportunity to participate in your care.     This office note has been dictated using Teaching laboratory technician.  Unfortunately, and despite my best efforts, this method of dictation can sometimes lead to occasional typographical or grammatical errors.  I apologize in advance if this occurs.     Theadora Rama Scales, PA-C 07/23/21 1422

## 2021-07-23 NOTE — ED Triage Notes (Signed)
Patient presents to [facility] for evaluation of sore throat starting yesterday, head pressure.

## 2021-07-23 NOTE — Discharge Instructions (Addendum)
You were tested for both COVID and influenza today.  The result of your viral testing will be posted to your MyChart once it is complete, this typically takes 24 to 48 hours.  If there is a positive result, you will be contacted by phone with further recommendations, if any.    I recommend that you begin taking Tamiflu now for empiric treatment of presumed influenza based on the history provided to me today along with my physical exam findings. Tamiflu is an antiviral medication that decreases the severity, duration and transmissibility of influenza virus by preventing the virus from reproducing itself in your body.     If the influenza result is positive, please continue the full 5-day course of Tamiflu.  If the result is negative, please feel free to discontinue Tamiflu if you prefer but do keep in mind that Tamiflu can also be taken preventatively.  Finishing the full 5-day course will decrease the chances of catching influenza from anyone else and will not cause harm otherwise.     Based on my physical exam today and the history that you provided, I also believe that you may have bacterial pharyngitis.  Your rapid strep test today, which is not a very good test, was negative.   Throat culture will be performed per protocol.   Because of your exposure to strep pharyngitis and the acute onset of your symptoms, as well as the redness and swelling in your throat, I also recommend that you begin taking antibiotics for empiric treatment of bacterial pharyngitis.  I have prescribed Cefdinir 300 mg twice daily for 10 days to treat you for bacterial pharyngitis.  Cefdinir covers group A strep along with other known causative organisms.  Please take this antibiotic as prescribed and do not skip any doses.  Once you have been on antibiotics for a full 24 hours, you are no longer considered contagious.       If you receive a phone call telling you that your throat culture is negative but you are feeling  significantly better after the first 48 hours of therapy, please feel free to finish the full 10-day course as it is likely bacterial pharyngitis caused by a bacterial organism other than strep.  Our culture here only tests for strep.       Please see the list below for other recommended medications, dosages and frequencies to provide relief of your current symptoms:     Ibuprofen  (Advil, Motrin): This is a good anti-inflammatory medication which addresses aches, pains and inflammation of the upper airways that causes sinus and nasal congestion as well as in the lower airways which makes your cough feel tight and sometimes burn.  I recommend that you take between 400 to 600 mg every 6-8 hours as needed.      Acetaminophen (Tylenol): This is a good fever reducer.  If your body temperature rises above 101.5 as measured with a thermometer, it is recommended that you take 1,000 mg every 8 hours until your temperature falls below 101.5, please not take more than 3,000 mg of acetaminophen either as a separate medication or as in ingredient in an over-the-counter cold/flu preparation within a 24-hour period.      Chloraseptic Throat Spray: Spray 5 sprays into affected area every 2 hours, hold for 15 seconds and either swallow or spit it out.  This is a excellent numbing medication because it is a spray, you can put it right where you needed and so sucking on a lozenge  and numbing your entire mouth.      Conservative care is also recommended at this time.  This includes rest, pushing clear fluids and activity as tolerated.  Warm beverages such as teas and broths versus cold beverages/popsicles and frozen sherbet/sorbet are your choice, both warm and cold are beneficial.  You may also notice that your appetite is reduced; this is okay as long as you are drinking plenty of clear fluids.    Please follow-up within the next 3 to 5 days either with your primary care provider or urgent care if your symptoms do not  resolve.  If you do not have a primary care provider, we will assist you in finding one.  Thank you for visiting urgent care today.  I appreciate the opportunity to participate in your care.

## 2021-07-24 LAB — COVID-19, FLU A+B NAA
Influenza A, NAA: NOT DETECTED
Influenza B, NAA: NOT DETECTED
SARS-CoV-2, NAA: NOT DETECTED

## 2021-07-25 LAB — CULTURE, GROUP A STREP (THRC)

## 2021-08-01 ENCOUNTER — Telehealth: Payer: Self-pay

## 2021-08-01 DIAGNOSIS — B3731 Acute candidiasis of vulva and vagina: Secondary | ICD-10-CM

## 2021-08-01 DIAGNOSIS — T3695XA Adverse effect of unspecified systemic antibiotic, initial encounter: Secondary | ICD-10-CM

## 2021-08-01 MED ORDER — FLUCONAZOLE 150 MG PO TABS: ORAL_TABLET | ORAL | 0 refills | Status: AC

## 2021-08-01 MED ORDER — FLUCONAZOLE 150 MG PO TABS: ORAL_TABLET | ORAL | 0 refills | Status: DC

## 2021-08-01 NOTE — Telephone Encounter (Signed)
Pt called urgent care requesting to have medication added, provider sent new medication. All questions answered and patient made aware of new medication.

## 2021-08-01 NOTE — Telephone Encounter (Signed)
Patient complains of vaginal yeast infection after 10-day course of cefdinir.  Patient complaining of thick white vaginal discharge and vaginal pruritus.  Patient denies pelvic pain, genital lesions, urinary outflow tract irritative symptoms.  Patient provided with a prescription for Diflucan to empirically treat her for vaginal candidiasis given recent antibiotic exposure.
